# Patient Record
Sex: Female | Born: 1965 | Race: White | Hispanic: No | Smoking: Former smoker
Health system: Southern US, Community
[De-identification: ages and names within clinical notes are randomized; demographics above are authoritative.]

## PROBLEM LIST (undated history)

## (undated) DIAGNOSIS — I10 Essential (primary) hypertension: Secondary | ICD-10-CM

## (undated) DIAGNOSIS — Z9889 Other specified postprocedural states: Secondary | ICD-10-CM

## (undated) DIAGNOSIS — F329 Major depressive disorder, single episode, unspecified: Secondary | ICD-10-CM

## (undated) DIAGNOSIS — E7800 Pure hypercholesterolemia, unspecified: Secondary | ICD-10-CM

## (undated) DIAGNOSIS — T884XXA Failed or difficult intubation, initial encounter: Secondary | ICD-10-CM

## (undated) DIAGNOSIS — K219 Gastro-esophageal reflux disease without esophagitis: Secondary | ICD-10-CM

## (undated) DIAGNOSIS — R112 Nausea with vomiting, unspecified: Secondary | ICD-10-CM

## (undated) DIAGNOSIS — I639 Cerebral infarction, unspecified: Secondary | ICD-10-CM

## (undated) DIAGNOSIS — Z8632 Personal history of gestational diabetes: Secondary | ICD-10-CM

## (undated) DIAGNOSIS — Z87442 Personal history of urinary calculi: Secondary | ICD-10-CM

## (undated) DIAGNOSIS — O1490 Unspecified pre-eclampsia, unspecified trimester: Secondary | ICD-10-CM

## (undated) DIAGNOSIS — C4490 Unspecified malignant neoplasm of skin, unspecified: Secondary | ICD-10-CM

## (undated) DIAGNOSIS — F32A Depression, unspecified: Secondary | ICD-10-CM

## (undated) DIAGNOSIS — G43909 Migraine, unspecified, not intractable, without status migrainosus: Secondary | ICD-10-CM

## (undated) DIAGNOSIS — I429 Cardiomyopathy, unspecified: Secondary | ICD-10-CM

## (undated) HISTORY — PX: OTHER SURGICAL HISTORY: SHX169

## (undated) HISTORY — DX: Gastro-esophageal reflux disease without esophagitis: K21.9

## (undated) HISTORY — DX: Essential (primary) hypertension: I10

## (undated) HISTORY — DX: Pure hypercholesterolemia, unspecified: E78.00

## (undated) HISTORY — DX: Major depressive disorder, single episode, unspecified: F32.9

## (undated) HISTORY — DX: Depression, unspecified: F32.A

---
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
## 1999-04-03 HISTORY — PX: NEPHROLITHOTOMY: SUR881

## 1999-04-24 ENCOUNTER — Inpatient Hospital Stay (HOSPITAL_COMMUNITY): Admission: RE | Admit: 1999-04-24 | Discharge: 1999-04-29 | Payer: Self-pay | Admitting: Urology

## 1999-04-24 ENCOUNTER — Encounter: Payer: Self-pay | Admitting: Urology

## 1999-05-13 ENCOUNTER — Encounter: Admission: RE | Admit: 1999-05-13 | Discharge: 1999-05-13 | Payer: Self-pay | Admitting: Urology

## 1999-08-03 DIAGNOSIS — I429 Cardiomyopathy, unspecified: Secondary | ICD-10-CM

## 1999-08-03 HISTORY — DX: Cardiomyopathy, unspecified: I42.9

## 1999-08-06 ENCOUNTER — Inpatient Hospital Stay (HOSPITAL_COMMUNITY): Admission: AD | Admit: 1999-08-06 | Discharge: 1999-08-06 | Payer: Self-pay | Admitting: Obstetrics and Gynecology

## 1999-08-13 ENCOUNTER — Inpatient Hospital Stay (HOSPITAL_COMMUNITY): Admission: AD | Admit: 1999-08-13 | Discharge: 1999-08-18 | Payer: Self-pay | Admitting: *Deleted

## 1999-08-18 ENCOUNTER — Inpatient Hospital Stay (HOSPITAL_COMMUNITY): Admission: AD | Admit: 1999-08-18 | Discharge: 1999-08-20 | Payer: Self-pay | Admitting: *Deleted

## 1999-08-18 ENCOUNTER — Encounter: Payer: Self-pay | Admitting: *Deleted

## 1999-08-21 ENCOUNTER — Encounter: Admission: RE | Admit: 1999-08-21 | Discharge: 1999-11-19 | Payer: Self-pay | Admitting: *Deleted

## 1999-09-03 ENCOUNTER — Encounter: Payer: Self-pay | Admitting: Urology

## 1999-09-03 ENCOUNTER — Ambulatory Visit (HOSPITAL_COMMUNITY): Admission: RE | Admit: 1999-09-03 | Discharge: 1999-09-03 | Payer: Self-pay | Admitting: Urology

## 1999-09-07 ENCOUNTER — Encounter: Admission: RE | Admit: 1999-09-07 | Discharge: 1999-09-07 | Payer: Self-pay | Admitting: Urology

## 1999-09-07 ENCOUNTER — Encounter: Payer: Self-pay | Admitting: Urology

## 1999-11-10 ENCOUNTER — Encounter: Payer: Self-pay | Admitting: *Deleted

## 1999-11-10 ENCOUNTER — Encounter: Admission: RE | Admit: 1999-11-10 | Discharge: 1999-11-10 | Payer: Self-pay | Admitting: *Deleted

## 1999-12-09 ENCOUNTER — Other Ambulatory Visit: Admission: RE | Admit: 1999-12-09 | Discharge: 1999-12-09 | Payer: Self-pay | Admitting: *Deleted

## 2000-02-15 ENCOUNTER — Encounter: Payer: Self-pay | Admitting: Family Medicine

## 2000-02-15 ENCOUNTER — Ambulatory Visit (HOSPITAL_COMMUNITY): Admission: RE | Admit: 2000-02-15 | Discharge: 2000-02-15 | Payer: Self-pay | Admitting: Family Medicine

## 2000-05-31 ENCOUNTER — Encounter: Admission: RE | Admit: 2000-05-31 | Discharge: 2000-05-31 | Payer: Self-pay | Admitting: Urology

## 2000-05-31 ENCOUNTER — Encounter: Payer: Self-pay | Admitting: Urology

## 2000-10-07 ENCOUNTER — Other Ambulatory Visit: Admission: RE | Admit: 2000-10-07 | Discharge: 2000-10-07 | Payer: Self-pay | Admitting: *Deleted

## 2000-11-14 ENCOUNTER — Encounter: Admission: RE | Admit: 2000-11-14 | Discharge: 2000-11-14 | Payer: Self-pay | Admitting: *Deleted

## 2000-11-14 ENCOUNTER — Encounter: Payer: Self-pay | Admitting: *Deleted

## 2000-12-20 ENCOUNTER — Encounter: Admission: RE | Admit: 2000-12-20 | Discharge: 2000-12-20 | Payer: Self-pay | Admitting: Urology

## 2000-12-20 ENCOUNTER — Encounter: Payer: Self-pay | Admitting: Urology

## 2001-11-03 ENCOUNTER — Other Ambulatory Visit: Admission: RE | Admit: 2001-11-03 | Discharge: 2001-11-03 | Payer: Self-pay | Admitting: *Deleted

## 2001-11-21 ENCOUNTER — Encounter: Payer: Self-pay | Admitting: *Deleted

## 2001-11-21 ENCOUNTER — Encounter: Admission: RE | Admit: 2001-11-21 | Discharge: 2001-11-21 | Payer: Self-pay | Admitting: *Deleted

## 2002-01-23 ENCOUNTER — Encounter: Payer: Self-pay | Admitting: Urology

## 2002-01-23 ENCOUNTER — Encounter: Admission: RE | Admit: 2002-01-23 | Discharge: 2002-01-23 | Payer: Self-pay | Admitting: Urology

## 2002-02-05 ENCOUNTER — Encounter: Payer: Self-pay | Admitting: Urology

## 2002-02-05 ENCOUNTER — Ambulatory Visit (HOSPITAL_BASED_OUTPATIENT_CLINIC_OR_DEPARTMENT_OTHER): Admission: RE | Admit: 2002-02-05 | Discharge: 2002-02-05 | Payer: Self-pay | Admitting: Urology

## 2002-02-19 ENCOUNTER — Encounter: Payer: Self-pay | Admitting: Urology

## 2002-02-19 ENCOUNTER — Encounter: Admission: RE | Admit: 2002-02-19 | Discharge: 2002-02-19 | Payer: Self-pay | Admitting: Urology

## 2002-07-31 ENCOUNTER — Encounter (HOSPITAL_COMMUNITY): Admission: RE | Admit: 2002-07-31 | Discharge: 2002-07-31 | Payer: Self-pay | Admitting: Urology

## 2002-07-31 ENCOUNTER — Encounter: Payer: Self-pay | Admitting: Urology

## 2003-03-18 ENCOUNTER — Other Ambulatory Visit: Admission: RE | Admit: 2003-03-18 | Discharge: 2003-03-18 | Payer: Self-pay | Admitting: Obstetrics and Gynecology

## 2004-04-14 ENCOUNTER — Encounter: Admission: RE | Admit: 2004-04-14 | Discharge: 2004-04-14 | Payer: Self-pay | Admitting: Obstetrics and Gynecology

## 2005-07-08 ENCOUNTER — Encounter: Admission: RE | Admit: 2005-07-08 | Discharge: 2005-07-08 | Payer: Self-pay | Admitting: Obstetrics and Gynecology

## 2006-07-12 ENCOUNTER — Encounter: Admission: RE | Admit: 2006-07-12 | Discharge: 2006-07-12 | Payer: Self-pay | Admitting: Family Medicine

## 2007-03-17 ENCOUNTER — Emergency Department (HOSPITAL_COMMUNITY): Admission: EM | Admit: 2007-03-17 | Discharge: 2007-03-17 | Payer: Self-pay | Admitting: Emergency Medicine

## 2007-03-27 ENCOUNTER — Emergency Department (HOSPITAL_COMMUNITY): Admission: EM | Admit: 2007-03-27 | Discharge: 2007-03-27 | Payer: Self-pay | Admitting: Emergency Medicine

## 2007-04-07 ENCOUNTER — Encounter: Admission: RE | Admit: 2007-04-07 | Discharge: 2007-04-07 | Payer: Self-pay | Admitting: Family Medicine

## 2007-08-24 ENCOUNTER — Encounter: Admission: RE | Admit: 2007-08-24 | Discharge: 2007-08-24 | Payer: Self-pay | Admitting: Obstetrics and Gynecology

## 2007-12-08 ENCOUNTER — Emergency Department (HOSPITAL_COMMUNITY): Admission: EM | Admit: 2007-12-08 | Discharge: 2007-12-08 | Payer: Self-pay | Admitting: Family Medicine

## 2009-09-02 ENCOUNTER — Encounter: Admission: RE | Admit: 2009-09-02 | Discharge: 2009-09-02 | Payer: Self-pay | Admitting: Obstetrics and Gynecology

## 2010-05-07 ENCOUNTER — Encounter (INDEPENDENT_AMBULATORY_CARE_PROVIDER_SITE_OTHER): Payer: Self-pay | Admitting: Family Medicine

## 2010-05-07 ENCOUNTER — Ambulatory Visit (HOSPITAL_COMMUNITY): Admission: RE | Admit: 2010-05-07 | Discharge: 2010-05-07 | Payer: Self-pay | Admitting: Family Medicine

## 2010-12-18 NOTE — Discharge Summary (Signed)
[name]  Patient:    Deborah Thompson                       MRN: [id number] Adm. Date:  [id number] Disc. Date: [id number] Attending:  Ardeen Fillers                           Discharge Summary  DISCHARGE DIAGNOSES:          1. Intrauterine pregnancy at [redacted] weeks gestational                                  age, delivered.                               2. Rh positive.                               3. Pregnancy-induced hypertension.                               4. History of staghorn calculus, status post left  nephrolithectomy.                               5. Intrapartum fever.                               6. Bradycardia and hypotension postpartum.  PROCEDURE:                    Spontaneous vaginal delivery, repair of midline episiotomy, and Pitocin induction.  HISTORY OF PRESENT ILLNESS:   A 45 year old woman, gravida 2, para 1, EDC September 02, 1999, on the basis of first trimester ultrasound, admitted at 84+ weeks gestational age for induction because of worsening pregnancy-induced hypertension.  The patient is screened at [redacted] weeks gestational age with blood pressure of 128/78. Blood pressure became elevated at [redacted] weeks gestational age and the patient was treated with bed rest.  Blood pressure on her left side most recently in the office on August 11, 1999, was 150/88.  Fetal growth has been normal and amniotic fluid has remained in the normal range.  The patient has no headaches, visual changes, or epigastric pain.  Because of increasing elevation of blood pressure, she is admitted for delivery.  Antenatal course is also remarkable for:  1) First trimester urinary tract infections and hematuria.  Staghorn calculus was identified.  She underwent removal of a left staghorn calculus by left nephrolithotomy at [redacted] weeks gestational age by Sigmund I. Patsi Sears, M.D.  She has intermittently grown organisms in her urine and has been  maintained on Macrobid prophylaxis.  2) Advanced maternal age. Amniocentesis revealed 46 chromosomes with normal amniotic fluid alpha fetoprotein.  HOSPITAL COURSE:              The patient was admitted to [facility] [place].  Cervix was closed and long.  Vaginal Cytotec was used.  Pitocin was initiated on August 15, 1999.  Membranes ruptured spontaneously.  The cervix was 1 to 2 cm on the morning of August 15, 1999.  IV sedation was given and epidural was also administered at the patients request.  Blood pressures remained reasonable  during the intrapartum period.  The patient was noted treated with magnesium as she had normal laboratory studies and no symptoms of PIH.   She developed a low grade fever prior to delivery.  Unasyn was initiated for presumed chorioamnionitis. The second stage was 11 minutes.  She was delivered spontaneously of a live female, 3270 grams with Apgars of 8 and 9 over a midline episiotomy.  Nuchal cord was reduced on the perineum.  Episiotomy was repaired without difficulty.  Initial increase in bleeding was felt to be secondary to uterine atony. Vigorous bimanual massage was performed.  The patient felt uncomfortable and in order to  allow evaluation of the vagina, the epidural was rebolused.  Very shortly thereafter, the patient became nauseated, faint, and developed hypotension and bradycardia.  Gretta Cool., M.D., anesthesia, was called to assist in management.   She was treated with IV hydration, Atropine, and Ephedrine, and oxygen.  Hypotensive symptoms resolved and pulse returned to normal range. Estimated blood loss was 500 cc.  There was no evidence of vaginal or cervical laceration.  Bradycardic hypotensive episode was felt to be secondary to a confluence of factors including 2-1/2 days of bed rest, relative dehydration, more than average postpartum blood loss, and vagal stimulation from uterine massage, as well  as central neural axis block from the epidural making her unable to mount a sympathetic neural response.  The remainder of her course in the hospital was unremarkable.  Her hemoglobin stabilized at 8.1.  This was well tolerated and managed conservatively.  Blood pressures remained with the diastolics in the high 90s.  This was managed with labetolol.  She was discharged to home in satisfactory condition on the third postpartum day.  Blood pressure at that time was 140/80.  She was still slightly tachycardic which was felt to be secondary to anemia.  She is given routine status post vaginal delivery instructions.  DISCHARGE MEDICATIONS:        1. Advil.                               2. Iron.                               3. Prenatal vitamins.  FOLLOW-UP:                    She will be followed up in the office in four to ix weeks and will return in one week for blood pressure check. DD:  09/23/99 TD:  09/23/99 Job: [id number]

## 2010-12-18 NOTE — Discharge Summary (Signed)
[facility]  Patient:    Deborah Thompson                       MRN: [id number] Adm. Date:  [id number] Disc. Date: [id number] Attending:  Ardeen Fillers CC:         Oley Balm. Sung Amabile, M.D.                           Discharge Summary  DISCHARGE DIAGNOSES:          1. Pulmonary edema.                               2. Pregnancy-induced hypertension.                               3. Anemia.                               4. Heart murmur.                               5. History of Proteus urinary tract infection.                               6. Status post vaginal delivery on August 15, 1999.                               7. History of staghorn calculus status post left                                  nephrolithotomy.  HISTORY OF PRESENT ILLNESS:   A 45 year old woman, gravida 2, para 2, status post vaginal delivery on August 15, 1999, with complaints of shortness of breath increasing on the date of admission, worse with lying flat.  The patient has noticed slight cough, but no sputum or hemoptysis.  She delivered vaginally on August 15, 1999, with an estimated blood loss of 500 cc.  After delivery she experienced a hypotensive, bradycardic episode which was felt to be secondary to vagal stimulation from bimanual uterine massage in a patient who was behind on fluids and with an epidural in place and thus unable o mound a sympathetic response.  She received 3 liters of fluids IV, Atropine and  Ephedrine and did well postpartum except for a persistently elevated blood pressure which was managed with labetolol since August 17, 1999.  Discharge hemoglobin as 8.1.  The patient had been induced at term for pregnancy-induced hypertension.   She was evaluated in the MAU.  At that time she had a low grade fever of 99.4, blood pressure 173/105, heart rate 108, and respiratory rate 24.  Bibasilar rales were heard and a systolic ejection murmur at the  apex was also noted. Hemoglobin was 8.0, white blood cell count 10.4.  Oxygen saturation on room air was between 91 and 95%.  Arterial blood gases revealed pH of 7.45, pO2 70, pCO2 33.  Chest x-ray showed diffuse bilateral interstitial process.  IMPRESSION:  Clinical scenario consistent with pulmonary edema. Because of the presentation without fever, pneumonia was not favored as was pulmonary embolus not likely given the bilateral process.  HOSPITAL COURSE:              Pulmonary consult was called in.  The patient was  seen by Oley Balm. Sung Amabile, M.D. of the [facility].  The patient was given Lasix and diuresed well.  She was mildly hypokalemic and this was replaced orally.  Chest x-ray on the third hospital day showed a persistent right lower lobe infiltrate, but overall improvement of the chest x-ray.  The diagnosis of pulmonary edema was still favored and the asymmetric finding was felt to be because of positioning on her right side during most of the hospitalization.  Blood pressure was managed with labetolol during the hospitalization.  She was discharged to home in satisfactory condition with no shortness of breath on the third hospital day.  She will be followed up by Onalee Hua B. Simonds, M.D. in his office within the next week.  Results of two-dimensional echocardiogram were pending.  She will be followed up by the [facility] n the next few days. DD:  09/23/99 TD:  09/23/99 Job: [id number]

## 2011-02-26 ENCOUNTER — Other Ambulatory Visit: Payer: Self-pay | Admitting: Obstetrics and Gynecology

## 2011-02-26 DIAGNOSIS — Z1231 Encounter for screening mammogram for malignant neoplasm of breast: Secondary | ICD-10-CM

## 2011-03-04 ENCOUNTER — Ambulatory Visit
Admission: RE | Admit: 2011-03-04 | Discharge: 2011-03-04 | Disposition: A | Discharge status: Home / Self Care | Payer: 59 | Source: Ambulatory Visit | Attending: Obstetrics and Gynecology | Admitting: Obstetrics and Gynecology

## 2011-03-04 DIAGNOSIS — Z1231 Encounter for screening mammogram for malignant neoplasm of breast: Secondary | ICD-10-CM

## 2012-03-29 ENCOUNTER — Other Ambulatory Visit: Payer: Self-pay | Admitting: Obstetrics and Gynecology

## 2012-03-29 DIAGNOSIS — Z1231 Encounter for screening mammogram for malignant neoplasm of breast: Secondary | ICD-10-CM

## 2012-04-12 ENCOUNTER — Ambulatory Visit
Admission: RE | Admit: 2012-04-12 | Discharge: 2012-04-12 | Disposition: A | Discharge status: Home / Self Care | Payer: 59 | Source: Ambulatory Visit | Attending: Obstetrics and Gynecology | Admitting: Obstetrics and Gynecology

## 2012-04-12 DIAGNOSIS — Z1231 Encounter for screening mammogram for malignant neoplasm of breast: Secondary | ICD-10-CM

## 2012-09-16 ENCOUNTER — Other Ambulatory Visit: Payer: Self-pay

## 2013-04-09 ENCOUNTER — Other Ambulatory Visit: Payer: Self-pay

## 2013-04-09 DIAGNOSIS — Z1231 Encounter for screening mammogram for malignant neoplasm of breast: Secondary | ICD-10-CM

## 2013-04-26 ENCOUNTER — Ambulatory Visit
Admission: RE | Admit: 2013-04-26 | Discharge: 2013-04-26 | Disposition: A | Discharge status: Home / Self Care | Payer: 59 | Source: Ambulatory Visit

## 2013-04-26 DIAGNOSIS — Z1231 Encounter for screening mammogram for malignant neoplasm of breast: Secondary | ICD-10-CM

## 2013-04-30 ENCOUNTER — Other Ambulatory Visit: Payer: Self-pay | Admitting: Obstetrics and Gynecology

## 2013-04-30 DIAGNOSIS — R928 Other abnormal and inconclusive findings on diagnostic imaging of breast: Secondary | ICD-10-CM

## 2013-05-10 ENCOUNTER — Ambulatory Visit
Admission: RE | Admit: 2013-05-10 | Discharge: 2013-05-10 | Disposition: A | Discharge status: Home / Self Care | Payer: 59 | Source: Ambulatory Visit | Attending: Obstetrics and Gynecology | Admitting: Obstetrics and Gynecology

## 2013-05-10 DIAGNOSIS — R928 Other abnormal and inconclusive findings on diagnostic imaging of breast: Secondary | ICD-10-CM

## 2013-06-07 ENCOUNTER — Other Ambulatory Visit: Payer: Self-pay

## 2013-06-14 ENCOUNTER — Encounter: Payer: Self-pay | Admitting: Internal Medicine

## 2013-08-02 DIAGNOSIS — C4490 Unspecified malignant neoplasm of skin, unspecified: Secondary | ICD-10-CM

## 2013-08-02 HISTORY — DX: Unspecified malignant neoplasm of skin, unspecified: C44.90

## 2013-08-07 ENCOUNTER — Ambulatory Visit (AMBULATORY_SURGERY_CENTER): Payer: Self-pay

## 2013-08-07 VITALS — Ht 66.0 in | Wt 180.0 lb

## 2013-08-07 DIAGNOSIS — Z1211 Encounter for screening for malignant neoplasm of colon: Secondary | ICD-10-CM

## 2013-08-07 MED ORDER — MOVIPREP 100 G PO SOLR: 1.0000 | Freq: Once | ORAL | Status: DC

## 2013-08-20 ENCOUNTER — Ambulatory Visit (AMBULATORY_SURGERY_CENTER): Payer: 59 | Admitting: Internal Medicine

## 2013-08-20 ENCOUNTER — Encounter: Payer: Self-pay | Admitting: Internal Medicine

## 2013-08-20 VITALS — BP 166/68 | HR 74 | Temp 99.1°F | Resp 31 | Ht 66.0 in | Wt 180.0 lb

## 2013-08-20 DIAGNOSIS — Z1211 Encounter for screening for malignant neoplasm of colon: Secondary | ICD-10-CM

## 2013-08-20 MED ORDER — SODIUM CHLORIDE 0.9 % IV SOLN: 500.0000 mL | INTRAVENOUS | Status: DC

## 2013-08-20 NOTE — Progress Notes (Signed)
B/P Elevated 154/103 left arm and 155 /107 right arm room staff made aware,no new orders received.

## 2013-08-20 NOTE — Patient Instructions (Signed)
YOU HAD AN ENDOSCOPIC PROCEDURE TODAY AT [facility]: Refer to the procedure report that was given to you for any specific questions about what was found during the examination.  If the procedure report does not answer your questions, please call your gastroenterologist to clarify.  If you requested that your care partner not be given the details of your procedure findings, then the procedure report has been included in a sealed envelope for you to review at your convenience later.  YOU SHOULD EXPECT: Some feelings of bloating in the abdomen. Passage of more gas than usual.  Walking can help get rid of the air that was put into your GI tract during the procedure and reduce the bloating. If you had a lower endoscopy (such as a colonoscopy or flexible sigmoidoscopy) you may notice spotting of blood in your stool or on the toilet paper. If you underwent a bowel prep for your procedure, then you may not have a normal bowel movement for a few days.  DIET: Your first meal following the procedure should be a light meal and then it is ok to progress to your normal diet.  A half-sandwich or bowl of soup is an example of a good first meal.  Heavy or fried foods are harder to digest and may make you feel nauseous or bloated.  Likewise meals heavy in dairy and vegetables can cause extra gas to form and this can also increase the bloating.  Drink plenty of fluids but you should avoid alcoholic beverages for 24 hours.  ACTIVITY: Your care partner should take you home directly after the procedure.  You should plan to take it easy, moving slowly for the rest of the day.  You can resume normal activity the day after the procedure however you should NOT DRIVE or use heavy machinery for 24 hours (because of the sedation medicines used during the test).    SYMPTOMS TO REPORT IMMEDIATELY: A gastroenterologist can be reached at any hour.  During normal business hours, 8:30 AM to 5:00 PM Monday through Friday,  call [phone].  After hours and on weekends, please call the GI answering service at [phone] who will take a message and have the physician on call contact you.   Following lower endoscopy (colonoscopy or flexible sigmoidoscopy):  Excessive amounts of blood in the stool  Significant tenderness or worsening of abdominal pains  Swelling of the abdomen that is new, acute  Fever of 100F or higher  FOLLOW UP: Our staff will call the home number listed on your records the next business day following your procedure to check on you and address any questions or concerns that you may have at that time regarding the information given to you following your procedure. This is a courtesy call and so if there is no answer at the home number and we have not heard from you through the emergency physician on call, we will assume that you have returned to your regular daily activities without incident.  SIGNATURES/CONFIDENTIALITY: You and/or your care partner have signed paperwork which will be entered into your electronic medical record.  These signatures attest to the fact that that the information above on your After Visit Summary has been reviewed and is understood.  Full responsibility of the confidentiality of this discharge information lies with you and/or your care-partner.  Please continue your normal medications  Please read over handouts about diverticulosis and high fiber diets  Follow up colonoscopy in 10 years

## 2013-08-20 NOTE — Progress Notes (Signed)
Report to pacu rn, vss, bbs=clear 

## 2013-08-20 NOTE — Op Note (Signed)
[place]  [address]. [place], [address]   COLONOSCOPY PROCEDURE REPORT  PATIENT: Deborah, Thompson  MR#: [id number] BIRTHDATE: [birth year] , 48  yrs. old GENDER: Female ENDOSCOPIST: Eustace Quail, MD REFERRED YK:DXIPJA Ehinger, M.D. PROCEDURE DATE:  08/20/2013 PROCEDURE:   Colonoscopy, screening First Screening Colonoscopy - Avg.  risk and is 50 yrs.  old or older Yes.  Prior Negative Screening - Now for repeat screening. N/A  History of Adenoma - Now for follow-up colonoscopy & has been > or = to 3 yrs.  N/A  Polyps Removed Today? No.  Recommend repeat exam, <10 yrs? No. ASA CLASS:   Class II INDICATIONS:average risk screening. MEDICATIONS: MAC sedation, administered by CRNA and propofol (Diprivan) 300mg  IV  DESCRIPTION OF PROCEDURE:   After the risks benefits and alternatives of the procedure were thoroughly explained, informed consent was obtained.  A digital rectal exam revealed no abnormalities of the rectum.   The LB [id number]  endoscope was introduced through the anus and advanced to the cecum, which was identified by both the appendix and ileocecal valve. No adverse events experienced.   The quality of the prep was good, using MoviPrep  The instrument was then slowly withdrawn as the colon was fully examined.      COLON FINDINGS: Moderate diverticulosis was noted in the sigmoid colon.   The colon was otherwise normal.  There was no inflammation, polyps or cancers .  Retroflexed views revealed no abnormalities. The time to cecum=2 minutes 39 seconds.  Withdrawal time=12 minutes 03 seconds.  The scope was withdrawn and the procedure completed.  COMPLICATIONS: There were no complications.  ENDOSCOPIC IMPRESSION: 1.   Moderate diverticulosis was noted in the sigmoid colon 2.   The colon was otherwise normal  RECOMMENDATIONS: 1. Continue current colorectal screening recommendations for "routine risk" patients with a repeat  colonoscopy in 10 years.   eSigned:  Eustace Quail, MD 08/20/2013 12:07 PM   cc: Gaynelle Arabian, MD and The Patient

## 2013-08-21 ENCOUNTER — Telehealth: Payer: Self-pay | Admitting: *Deleted

## 2013-08-21 NOTE — Telephone Encounter (Signed)
Left message that we called for f/u 

## 2015-08-03 DIAGNOSIS — I639 Cerebral infarction, unspecified: Secondary | ICD-10-CM

## 2015-08-03 HISTORY — DX: Cerebral infarction, unspecified: I63.9

## 2015-08-06 DIAGNOSIS — M9901 Segmental and somatic dysfunction of cervical region: Secondary | ICD-10-CM | POA: Diagnosis not present

## 2015-08-06 DIAGNOSIS — M9902 Segmental and somatic dysfunction of thoracic region: Secondary | ICD-10-CM | POA: Diagnosis not present

## 2015-08-06 DIAGNOSIS — M546 Pain in thoracic spine: Secondary | ICD-10-CM | POA: Diagnosis not present

## 2015-08-06 DIAGNOSIS — M545 Low back pain: Secondary | ICD-10-CM | POA: Diagnosis not present

## 2015-08-07 DIAGNOSIS — M9902 Segmental and somatic dysfunction of thoracic region: Secondary | ICD-10-CM | POA: Diagnosis not present

## 2015-08-07 DIAGNOSIS — M545 Low back pain: Secondary | ICD-10-CM | POA: Diagnosis not present

## 2015-08-07 DIAGNOSIS — M546 Pain in thoracic spine: Secondary | ICD-10-CM | POA: Diagnosis not present

## 2015-08-07 DIAGNOSIS — M9901 Segmental and somatic dysfunction of cervical region: Secondary | ICD-10-CM | POA: Diagnosis not present

## 2015-08-11 MED FILL — METOPROLOL SUCC ER 100 MG T: 100 | 90 days supply | Qty: 90 | Fill #1

## 2015-08-11 MED FILL — ATORVASTATIN 40 MG TABLET: 40 | 90 days supply | Qty: 90 | Fill #0

## 2015-08-20 DIAGNOSIS — I10 Essential (primary) hypertension: Secondary | ICD-10-CM | POA: Diagnosis not present

## 2015-08-20 DIAGNOSIS — Z0000 Encounter for general adult medical examination without abnormal findings: Secondary | ICD-10-CM | POA: Diagnosis not present

## 2015-08-20 DIAGNOSIS — M792 Neuralgia and neuritis, unspecified: Secondary | ICD-10-CM | POA: Diagnosis not present

## 2015-08-20 DIAGNOSIS — E7800 Pure hypercholesterolemia, unspecified: Secondary | ICD-10-CM | POA: Diagnosis not present

## 2015-08-20 DIAGNOSIS — K219 Gastro-esophageal reflux disease without esophagitis: Secondary | ICD-10-CM | POA: Diagnosis not present

## 2015-08-20 DIAGNOSIS — F322 Major depressive disorder, single episode, severe without psychotic features: Secondary | ICD-10-CM | POA: Diagnosis not present

## 2015-09-01 MED FILL — PANTOPRAZOLE SOD DR 40 MG T: 40 | 90 days supply | Qty: 90 | Fill #0

## 2015-09-01 MED FILL — VENLAFAXINE HCL ER 75 MG CA: 75 | 90 days supply | Qty: 90 | Fill #0

## 2015-09-30 DIAGNOSIS — H5213 Myopia, bilateral: Secondary | ICD-10-CM | POA: Diagnosis not present

## 2015-09-30 DIAGNOSIS — H524 Presbyopia: Secondary | ICD-10-CM | POA: Diagnosis not present

## 2015-10-03 DIAGNOSIS — I10 Essential (primary) hypertension: Secondary | ICD-10-CM | POA: Diagnosis not present

## 2015-10-13 MED FILL — LISINOPRIL 40 MG TABLET: 40 | 90 days supply | Qty: 90 | Fill #1

## 2015-11-03 ENCOUNTER — Ambulatory Visit (INDEPENDENT_AMBULATORY_CARE_PROVIDER_SITE_OTHER): Payer: 59 | Admitting: Cardiology

## 2015-11-03 ENCOUNTER — Encounter: Payer: Self-pay | Admitting: Cardiology

## 2015-11-03 VITALS — BP 150/98 | HR 88 | Ht 66.0 in | Wt 186.4 lb

## 2015-11-03 DIAGNOSIS — I10 Essential (primary) hypertension: Secondary | ICD-10-CM | POA: Diagnosis not present

## 2015-11-03 DIAGNOSIS — E785 Hyperlipidemia, unspecified: Secondary | ICD-10-CM | POA: Diagnosis not present

## 2015-11-03 MED ORDER — SPIRONOLACTONE 25 MG PO TABS: 12.5000 mg | ORAL_TABLET | Freq: Every day | ORAL | Status: DC

## 2015-11-03 MED FILL — METOPROLOL SUCC ER 100 MG T: 100 | 30 days supply | Qty: 45 | Fill #0

## 2015-11-03 MED FILL — SPIRONOLACTONE 25 MG TABLET: 25 | 90 days supply | Qty: 45 | Fill #0

## 2015-11-03 NOTE — Progress Notes (Signed)
Cardiology Office Note    Date:  11/03/2015   ID:  Deborah Thompson, DOB Mar 30, 1966, MRN [id number]  PCP:  Simona Huh, MD  Cardiologist:   Candee Furbish, MD     History of Present Illness:  Deborah Thompson is a 50 y.o. female here for evaluation of difficult to control hypertension at the request of Dr. [name].  Office note from 10/03/15 reviewed home blood pressures were high, no chest pain, no dyspnea, no dizziness.   -Previously stopped amlodipine on 01/2015 because of tachycardia noted when she tried to donate blood. -Tolerating lisinopril 40 mg daily -Side effects to hydrochlorothiazide, stopped -Metoprolol succinate-XL 100 mg daily. Blood pressure has been in the 160/100 range with pulses in the 70s.  Was on Lipitor since her 28's.   Preeclampsia both pregnancy.   For last year BP increased. Has gained a few pounds over this time.  Sometimes may have a headache but no strokelike symptoms, visual disturbance, anginal symptoms, shortness of breath.  Mom died age 18 suddenly.  Father had MI in 29's.   Trying to exercise. Diet habits terribly. 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  ECHO 2011 - normal  Renal stones.   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 Divorced, 2 children, one is vegan  Past Medical History  Diagnosis Date  . Hypercholesterolemia   . Hypertension   . GERD (gastroesophageal reflux disease)   . Depression     Past Surgical History  Procedure Laterality Date  . Nephrolithotomy  04-1999    during pregnancy, left kidney  . Wisdom tooth extraction  1984    Current Medications: Outpatient Prescriptions Prior to Visit  Medication Sig Dispense Refill  . atorvastatin (LIPITOR) 40 MG tablet Take 40 mg by mouth daily.    . Calcium Carb-Cholecalciferol (CALCIUM + D3 PO) Take by mouth. 600 mg    . Multiple Vitamin (MULTIVITAMIN) tablet Take 1 tablet by mouth daily.    . Omega-3 Fatty Acids (FISH OIL) 1000 MG CPDR Take  by mouth 2 (two) times daily.    Marland Kitchen OVER THE COUNTER MEDICATION Glucosamine 4000mg  daily    . pantoprazole (PROTONIX) 40 MG tablet Take 40 mg by mouth daily.    Marland Kitchen venlafaxine XR (EFFEXOR-XR) 75 MG 24 hr capsule Take 75 mg by mouth daily with breakfast.    . lisinopril (PRINIVIL,ZESTRIL) 20 MG tablet Take 20 mg by mouth daily.     No facility-administered medications prior to visit.     Allergies:   Review of patient's allergies indicates no known allergies.   Social History   Social History  . Marital Status: Married    Spouse Name: N/A  . Number of Children: N/A  . Years of Education: N/A   Social History Main Topics  . Smoking status: Former Smoker    Types: Cigarettes  . Smokeless tobacco: Never Used  . Alcohol Use: Yes     Comment: SOCIALLY  . Drug Use: No  . Sexual Activity: Not Asked   Other Topics Concern  . None   Social History Narrative     Family History:  The patient's family history includes Heart disease in her father and mother. There is no history of Colon cancer.   ROS:   Please see the history of present illness.   Positive for depression, headaches ROS All other systems reviewed and are negative.   PHYSICAL EXAM:   VS:  BP 150/98 mmHg  Pulse 88  Ht 5'  6" (1.676 m)  Wt 186 lb 6.4 oz (84.55 kg)  BMI 30.10 kg/m2   GEN: Well nourished, well developed, in no acute distress HEENT: normal Neck: no JVD, carotid bruits, or masses Cardiac: RRR; no murmurs, rubs, or gallops,no edema  Respiratory:  clear to auscultation bilaterally, normal work of breathing GI: soft, nontender, nondistended, + BS, no abdominal bruits MS: no deformity or atrophy Skin: warm and dry, no rash Neuro:  Alert and Oriented x 3, Strength and sensation are intact Psych: euthymic mood, full affect  Wt Readings from Last 3 Encounters:  11/03/15 186 lb 6.4 oz (84.55 kg)  08/20/13 180 lb (81.647 kg)  08/07/13 180 lb (81.647 kg)      Studies/Labs Reviewed:   EKG:  EKG is  ordered today.  The ekg ordered today demonstrates 11/03/15-sinus rhythm, 88, nonspecific ST-T wave changes personally viewed  Recent Labs: No results found for requested labs within last 365 days.   Personally reviewed lab work from 02/25/15-hemoglobin 12.8, platelets 257, TSH 1.78, sodium 138, potassium 3.8, creatinine 0.75, LDL 89, triglycerides 233, HDL 42, ALT 21  Lipid Panel No results found for: CHOL, TRIG, HDL, CHOLHDL, VLDL, LDLCALC, LDLDIRECT as above  Additional studies/ records that were reviewed today include:  Office notes, lab work, EKG    ASSESSMENT:    1. Uncontrolled hypertension   2. Essential hypertension   3. Hyperlipidemia      PLAN:  In order of problems listed above:  Uncontrolled hypertension -After her second pregnancy and second bout of preeclampsia, lisinopril has been able to maintain her blood pressure with monotherapy. Until recently, she has required increased pharmacotherapy. Her weight has increased slightly as well. Encouraged her to watch her salt intake, diet, exercise, weight loss. Decrease overall caffeine use. -We will check a renal duplex ultrasound to ensure that there is no gross evidence of renal artery stenosis or fibromuscular dysplasia. -We will start spironolactone 12.5 mg once a day and check a basic metabolic profile in one week. As I described to her, sometimes when people are struggling with multiple pharmacologic therapy for blood pressure, the addition of spironolactone can help out significantly. If this does not work, potentially trying a low-dose loop diuretic such as Lasix 20 mg may be beneficial. -She does not seem to tolerate amlodipine well in the past because of potential tachycardia, she did not tolerate hydrochlorothiazide well, side effects.  Hyperlipidemia -Atorvastatin since her 4s. Excellent lipid control.  Family history of early coronary artery disease -Father heart attack in his 57s, mother died suddenly in her  34s. Continue with aggressive risk factor modification.  Medication Adjustments/Labs and Tests Ordered: Current medicines are reviewed at length with the patient today.  Concerns regarding medicines are outlined above.  Medication changes, Labs and Tests ordered today are listed in the Patient Instructions below. Patient Instructions  Medication Instructions:  Please start Spironolactone 25 mg 1/2 tablet a day. Continue all other medications as listed.  Labwork: Please return in 1 week for lab work (BMP)  Testing/Procedures: Your physician has requested that you have a renal artery duplex. During this test, an ultrasound is used to evaluate blood flow to the kidneys. Allow one hour for this exam. Do not eat after midnight the day before and avoid carbonated beverages. Take your medications as you usually do.  Follow-Up: Follow up in 1 month with Dr Marlou Porch.  If you need a refill on your cardiac medications before your next appointment, please call your pharmacy.  Thank you  for choosing [facility]!!    '      Signed, Candee Furbish, MD  11/03/2015 12:17 PM    [facility] [place], [place], [place]  [address] Phone: [phone]; Fax: [phone]

## 2015-11-03 NOTE — Patient Instructions (Signed)
Medication Instructions:  Please start Spironolactone 25 mg 1/2 tablet a day. Continue all other medications as listed.  Labwork: Please return in 1 week for lab work (BMP)  Testing/Procedures: Your physician has requested that you have a renal artery duplex. During this test, an ultrasound is used to evaluate blood flow to the kidneys. Allow one hour for this exam. Do not eat after midnight the day before and avoid carbonated beverages. Take your medications as you usually do.  Follow-Up: Follow up in 1 month with Dr Marlou Porch.  If you need a refill on your cardiac medications before your next appointment, please call your pharmacy.  Thank you for choosing [facility]!!    '

## 2015-11-10 ENCOUNTER — Other Ambulatory Visit (INDEPENDENT_AMBULATORY_CARE_PROVIDER_SITE_OTHER): Payer: 59 | Admitting: *Deleted

## 2015-11-10 DIAGNOSIS — M9902 Segmental and somatic dysfunction of thoracic region: Secondary | ICD-10-CM | POA: Diagnosis not present

## 2015-11-10 DIAGNOSIS — M545 Low back pain: Secondary | ICD-10-CM | POA: Diagnosis not present

## 2015-11-10 DIAGNOSIS — I10 Essential (primary) hypertension: Secondary | ICD-10-CM

## 2015-11-10 DIAGNOSIS — M546 Pain in thoracic spine: Secondary | ICD-10-CM | POA: Diagnosis not present

## 2015-11-10 DIAGNOSIS — M9901 Segmental and somatic dysfunction of cervical region: Secondary | ICD-10-CM | POA: Diagnosis not present

## 2015-11-10 LAB — BASIC METABOLIC PANEL
BUN: 11 mg/dL (ref 7–25)
CHLORIDE: 101 mmol/L (ref 98–110)
CO2: 29 mmol/L (ref 20–31)
CREATININE: 0.64 mg/dL (ref 0.50–1.05)
Calcium: 10.2 mg/dL (ref 8.6–10.4)
Glucose, Bld: 92 mg/dL (ref 65–99)
POTASSIUM: 3.9 mmol/L (ref 3.5–5.3)
SODIUM: 139 mmol/L (ref 135–146)

## 2015-11-10 MED FILL — ATORVASTATIN 40 MG TABLET: 40 | 90 days supply | Qty: 90 | Fill #0

## 2015-11-11 ENCOUNTER — Ambulatory Visit (HOSPITAL_COMMUNITY)
Admission: RE | Admit: 2015-11-11 | Discharge: 2015-11-11 | Disposition: A | Discharge status: Home / Self Care | Payer: 59 | Source: Ambulatory Visit | Attending: Cardiology | Admitting: Cardiology

## 2015-11-11 DIAGNOSIS — I10 Essential (primary) hypertension: Secondary | ICD-10-CM | POA: Insufficient documentation

## 2015-11-11 DIAGNOSIS — N1330 Unspecified hydronephrosis: Secondary | ICD-10-CM | POA: Insufficient documentation

## 2015-11-11 DIAGNOSIS — F329 Major depressive disorder, single episode, unspecified: Secondary | ICD-10-CM | POA: Insufficient documentation

## 2015-11-11 DIAGNOSIS — E7800 Pure hypercholesterolemia, unspecified: Secondary | ICD-10-CM | POA: Insufficient documentation

## 2015-11-11 DIAGNOSIS — K219 Gastro-esophageal reflux disease without esophagitis: Secondary | ICD-10-CM | POA: Diagnosis not present

## 2015-11-18 DIAGNOSIS — Z6829 Body mass index (BMI) 29.0-29.9, adult: Secondary | ICD-10-CM | POA: Diagnosis not present

## 2015-11-18 DIAGNOSIS — Z1231 Encounter for screening mammogram for malignant neoplasm of breast: Secondary | ICD-10-CM | POA: Diagnosis not present

## 2015-11-18 DIAGNOSIS — Z01419 Encounter for gynecological examination (general) (routine) without abnormal findings: Secondary | ICD-10-CM | POA: Diagnosis not present

## 2015-11-24 MED FILL — VENLAFAXINE HCL ER 75 MG CA: 75 | 90 days supply | Qty: 90 | Fill #1

## 2015-12-03 ENCOUNTER — Encounter: Payer: Self-pay | Admitting: Cardiology

## 2015-12-03 ENCOUNTER — Ambulatory Visit (INDEPENDENT_AMBULATORY_CARE_PROVIDER_SITE_OTHER): Payer: 59 | Admitting: Cardiology

## 2015-12-03 VITALS — BP 124/80 | HR 98 | Ht 66.5 in | Wt 186.8 lb

## 2015-12-03 DIAGNOSIS — I10 Essential (primary) hypertension: Secondary | ICD-10-CM

## 2015-12-03 DIAGNOSIS — E785 Hyperlipidemia, unspecified: Secondary | ICD-10-CM

## 2015-12-03 NOTE — Progress Notes (Signed)
Cardiology Office Note    Date:  12/03/2015   ID:  KEYSHA Thompson, DOB July 17, 1966, MRN [id number]  PCP:  Simona Huh, MD  Cardiologist:   Candee Furbish, MD     History of Present Illness:  Deborah Thompson is a 50 y.o. female here for evaluation of difficult to control hypertension at the request of Dr. [name].  Office note from 10/03/15 reviewed home blood pressures were high, no chest pain, no dyspnea, no dizziness.   -Previously stopped amlodipine on 01/2015 because of tachycardia noted when she tried to donate blood. -Tolerating lisinopril 40 mg daily -Side effects to hydrochlorothiazide, stopped -Metoprolol succinate-XL 100 mg daily. Blood pressure has been in the 160/100 range with pulses in the 70s.  Was on Lipitor since her 31's.   Preeclampsia both pregnancy.   For last year BP increased. Has gained a few pounds over this time.  Sometimes may have a headache but no strokelike symptoms, visual disturbance, anginal symptoms, shortness of breath.  Mom died age 16 suddenly.  Father had MI in 10's.   Trying to exercise. Diet habits terribly. 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  ECHO 2011 - normal  Renal stones.   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 Divorced, 2 children, one is vegan  Past Medical History  Diagnosis Date  . Hypercholesterolemia   . Hypertension   . GERD (gastroesophageal reflux disease)   . Depression     Past Surgical History  Procedure Laterality Date  . Nephrolithotomy  04-1999    during pregnancy, left kidney  . Wisdom tooth extraction  1984    Current Medications: Outpatient Prescriptions Prior to Visit  Medication Sig Dispense Refill  . atorvastatin (LIPITOR) 40 MG tablet Take 40 mg by mouth daily.    . Calcium Carb-Cholecalciferol (CALCIUM + D3 PO) Take by mouth. 600 mg    . lisinopril (PRINIVIL,ZESTRIL) 40 MG tablet Take 40 mg by mouth daily.   1  . metoprolol succinate (TOPROL-XL) 100 MG 24 hr  tablet Take 50 mg by mouth daily.     . Multiple Vitamin (MULTIVITAMIN) tablet Take 1 tablet by mouth daily.    . Omega-3 Fatty Acids (FISH OIL) 1000 MG CPDR Take by mouth 2 (two) times daily.    Marland Kitchen OVER THE COUNTER MEDICATION Glucosamine 4000mg  daily    . pantoprazole (PROTONIX) 40 MG tablet Take 40 mg by mouth daily.    Marland Kitchen spironolactone (ALDACTONE) 25 MG tablet Take 0.5 tablets (12.5 mg total) by mouth daily. 45 tablet 3  . venlafaxine XR (EFFEXOR-XR) 75 MG 24 hr capsule Take 75 mg by mouth daily with breakfast.     No facility-administered medications prior to visit.     Allergies:   Review of patient's allergies indicates no known allergies.   Social History   Social History  . Marital Status: Married    Spouse Name: N/A  . Number of Children: N/A  . Years of Education: N/A   Social History Main Topics  . Smoking status: Former Smoker    Types: Cigarettes  . Smokeless tobacco: Never Used  . Alcohol Use: Yes     Comment: SOCIALLY  . Drug Use: No  . Sexual Activity: Not Asked   Other Topics Concern  . None   Social History Narrative     Family History:  The patient's family history includes Heart disease in her father and mother. There is no history of Colon cancer.   ROS:  Please see the history of present illness.   Positive for depression, headaches ROS All other systems reviewed and are negative.   PHYSICAL EXAM:   VS:  BP 124/80 mmHg  Pulse 98  Ht 5' 6.5" (1.689 m)  Wt 186 lb 12.8 oz (84.732 kg)  BMI 29.70 kg/m2   GEN: Well nourished, well developed, in no acute distress HEENT: normal Neck: no JVD, carotid bruits, or masses Cardiac: RRR; no murmurs, rubs, or gallops,no edema  Respiratory:  clear to auscultation bilaterally, normal work of breathing GI: soft, nontender, nondistended, + BS, no abdominal bruits MS: no deformity or atrophy Skin: warm and dry, no rash Neuro:  Alert and Oriented x 3, Strength and sensation are intact Psych: euthymic mood,  full affect  Wt Readings from Last 3 Encounters:  12/03/15 186 lb 12.8 oz (84.732 kg)  11/03/15 186 lb 6.4 oz (84.55 kg)  08/20/13 180 lb (81.647 kg)      Studies/Labs Reviewed:   EKG:  EKG is ordered today.  The ekg ordered today demonstrates 11/03/15-sinus rhythm, 88, nonspecific ST-T wave changes personally viewed  Recent Labs: 11/10/2015: BUN 11; Creat 0.64; Potassium 3.9; Sodium 139   Personally reviewed lab work from 02/25/15-hemoglobin 12.8, platelets 257, TSH 1.78, sodium 138, potassium 3.8, creatinine 0.75, LDL 89, triglycerides 233, HDL 42, ALT 21  Lipid Panel No results found for: CHOL, TRIG, HDL, CHOLHDL, VLDL, LDLCALC, LDLDIRECT as above  Additional studies/ records that were reviewed today include:  Office notes, lab work, EKG  Renal duplex 11/11/15: Normal caliber abdominal aorta. Normal and symmetrical kidney size.  Hydronephrosis is noted in the left kidney, with what appears to be a stone.  Normal renal arteries, bilaterally. The IVC and renal veins are patent.  Overall reassuring renal arteries with no evidence of renal artery stenosis. She does appear to have previous issues with nephrolithiasis of the left kidney. Please send results to Dr. [name] Thank you.  Candee Furbish, MD   ASSESSMENT:    1. Essential hypertension   2. Hyperlipidemia      PLAN:  In order of problems listed above:  Uncontrolled hypertension -After her second pregnancy and second bout of preeclampsia, lisinopril has been able to maintain her blood pressure with monotherapy. Until recently, she has required increased pharmacotherapy. Her weight has increased slightly as well. Encouraged her to watch her salt intake, diet, exercise, weight loss. Decrease overall caffeine use. -Renal duplex ultrasound- no gross evidence of renal artery stenosis or fibromuscular dysplasia. -We will start spironolactone 12.5 mg once a day -basic metabolic profile reassuring post start. As I described  to her, sometimes when people are struggling with multiple pharmacologic therapy for blood pressure, the addition of spironolactone can help out significantly. If this does not work, potentially trying a low-dose loop diuretic such as Lasix 20 mg may be beneficial. -She does not seem to tolerate amlodipine well in the past because of potential tachycardia, she did not tolerate hydrochlorothiazide well, side effects.  Hyperlipidemia -Atorvastatin since her 55s. Excellent lipid control.  Family history of early coronary artery disease -Father heart attack in his 81s, mother died suddenly in her 71s. Continue with aggressive risk factor modification.  Medication Adjustments/Labs and Tests Ordered: Current medicines are reviewed at length with the patient today.  Concerns regarding medicines are outlined above.  Medication changes, Labs and Tests ordered today are listed in the Patient Instructions below. Patient Instructions  Medication Instructions:  Your physician recommends that you continue on your current medications as  directed. Please refer to the Current Medication list given to you today.   Labwork: none  Testing/Procedures: none  Follow-Up: Your physician wants you to follow-up in: [place]. You will receive a reminder letter in the mail two months in advance. If you don't receive a letter, please call our office to schedule the follow-up appointment.   Any Other Special Instructions Will Be Listed Below (If Applicable).     If you need a refill on your cardiac medications before your next appointment, please call your pharmacy.       Bobby Rumpf, MD  12/03/2015 8:59 AM    [address], [place], [place]  [address] Phone: [phone]; Fax: [phone]

## 2015-12-03 NOTE — Patient Instructions (Signed)
Medication Instructions:  Your physician recommends that you continue on your current medications as directed. Please refer to the Current Medication list given to you today.   Labwork: none  Testing/Procedures: none  Follow-Up: Your physician wants you to follow-up in: [place]. You will receive a reminder letter in the mail two months in advance. If you don't receive a letter, please call our office to schedule the follow-up appointment.   Any Other Special Instructions Will Be Listed Below (If Applicable).     If you need a refill on your cardiac medications before your next appointment, please call your pharmacy.

## 2015-12-11 MED FILL — PANTOPRAZOLE SOD DR 40 MG T: 40 | 90 days supply | Qty: 90 | Fill #1

## 2016-01-01 DIAGNOSIS — H35371 Puckering of macula, right eye: Secondary | ICD-10-CM | POA: Diagnosis not present

## 2016-01-01 DIAGNOSIS — H43813 Vitreous degeneration, bilateral: Secondary | ICD-10-CM | POA: Diagnosis not present

## 2016-01-12 MED FILL — LISINOPRIL 40 MG TABLET: 40 | 90 days supply | Qty: 90 | Fill #0

## 2016-01-23 ENCOUNTER — Other Ambulatory Visit (HOSPITAL_COMMUNITY): Payer: Self-pay | Admitting: Family Medicine

## 2016-01-23 DIAGNOSIS — R51 Headache: Secondary | ICD-10-CM

## 2016-01-23 DIAGNOSIS — R5383 Other fatigue: Secondary | ICD-10-CM | POA: Diagnosis not present

## 2016-01-23 DIAGNOSIS — R410 Disorientation, unspecified: Secondary | ICD-10-CM

## 2016-01-23 DIAGNOSIS — R519 Headache, unspecified: Secondary | ICD-10-CM

## 2016-01-24 ENCOUNTER — Ambulatory Visit (HOSPITAL_COMMUNITY)
Admission: RE | Admit: 2016-01-24 | Discharge: 2016-01-24 | Disposition: A | Discharge status: Home / Self Care | Payer: 59 | Source: Ambulatory Visit | Attending: Family Medicine | Admitting: Family Medicine

## 2016-01-24 DIAGNOSIS — R410 Disorientation, unspecified: Secondary | ICD-10-CM | POA: Diagnosis not present

## 2016-01-24 DIAGNOSIS — R51 Headache: Secondary | ICD-10-CM | POA: Diagnosis not present

## 2016-01-24 DIAGNOSIS — R9089 Other abnormal findings on diagnostic imaging of central nervous system: Secondary | ICD-10-CM | POA: Diagnosis not present

## 2016-01-24 DIAGNOSIS — R519 Headache, unspecified: Secondary | ICD-10-CM

## 2016-01-24 LAB — POCT I-STAT CREATININE: CREATININE: 0.8 mg/dL (ref 0.44–1.00)

## 2016-01-24 MED ORDER — GADOBENATE DIMEGLUMINE 529 MG/ML IV SOLN
18.0000 mL | Freq: Once | INTRAVENOUS | Status: AC | PRN
  Administered 2016-01-24: 18 mL via INTRAVENOUS

## 2016-01-26 ENCOUNTER — Other Ambulatory Visit (HOSPITAL_COMMUNITY): Payer: Self-pay | Admitting: Family Medicine

## 2016-01-26 DIAGNOSIS — I639 Cerebral infarction, unspecified: Secondary | ICD-10-CM

## 2016-01-30 ENCOUNTER — Ambulatory Visit (HOSPITAL_BASED_OUTPATIENT_CLINIC_OR_DEPARTMENT_OTHER)
Admission: RE | Admit: 2016-01-30 | Discharge: 2016-01-30 | Disposition: A | Discharge status: Home / Self Care | Payer: 59 | Source: Ambulatory Visit | Attending: Family Medicine | Admitting: Family Medicine

## 2016-01-30 ENCOUNTER — Other Ambulatory Visit (HOSPITAL_COMMUNITY): Payer: Self-pay | Admitting: Family Medicine

## 2016-01-30 ENCOUNTER — Ambulatory Visit (HOSPITAL_COMMUNITY)
Admission: RE | Admit: 2016-01-30 | Discharge: 2016-01-30 | Disposition: A | Discharge status: Home / Self Care | Payer: 59 | Source: Ambulatory Visit | Attending: Family Medicine | Admitting: Family Medicine

## 2016-01-30 DIAGNOSIS — I639 Cerebral infarction, unspecified: Secondary | ICD-10-CM | POA: Diagnosis not present

## 2016-01-30 DIAGNOSIS — I10 Essential (primary) hypertension: Secondary | ICD-10-CM | POA: Diagnosis not present

## 2016-01-30 LAB — ECHOCARDIOGRAM COMPLETE
E decel time: 282 msec
EERAT: 7.47
FS: 43 % (ref 28–44)
IVS/LV PW RATIO, ED: 0.97
LA diam end sys: 38 mm
LA diam index: 1.88 cm/m2
LA vol A4C: 33.3 ml
LA vol index: 21.5 mL/m2
LASIZE: 38 mm
LAVOL: 43.3 mL
LDCA: 3.14 cm2
LV E/e' medial: 7.47
LV E/e'average: 7.47
LV PW d: 9.75 mm — AB (ref 0.6–1.1)
LV TDI E'LATERAL: 10.2
LV e' LATERAL: 10.2 cm/s
LVOTD: 20 mm
MV Dec: 282
MV Peak grad: 2 mmHg
MV pk A vel: 106 m/s
MVPKEVEL: 76.2 m/s
RV LATERAL S' VELOCITY: 15 cm/s
TAPSE: 19.1 mm
TDI e' medial: 7.29

## 2016-01-30 LAB — VAS US CAROTID
LEFT ECA DIAS: -15 cm/s
LEFT VERTEBRAL DIAS: -23 cm/s
LICADDIAS: -38 cm/s
LICADSYS: -78 cm/s
LICAPDIAS: 18 cm/s
LICAPSYS: 63 cm/s
Left CCA dist dias: -21 cm/s
Left CCA dist sys: -96 cm/s
Left CCA prox dias: 22 cm/s
Left CCA prox sys: 123 cm/s
RIGHT ECA DIAS: -22 cm/s
Right CCA prox dias: -24 cm/s
Right CCA prox sys: -99 cm/s
Right cca dist sys: -85 cm/s

## 2016-01-30 NOTE — Progress Notes (Signed)
  Echocardiogram 2D Echocardiogram has been performed.  Deborah Thompson 01/30/2016, 3:34 PM

## 2016-01-30 NOTE — Progress Notes (Signed)
Preliminary results by tech - Carotid Duplex Completed. No evidence of a stenosis noted in bilateral carotid arteries. Vertebral arteries demonstrated antegrade flow.  Oda Cogan, BS, RDMS, RVT

## 2016-02-02 MED FILL — SPIRONOLACTONE 25 MG TABLET: 25 | 90 days supply | Qty: 45 | Fill #1

## 2016-02-02 MED FILL — METOPROLOL SUCC ER 100 MG T: 100 | 30 days supply | Qty: 45 | Fill #1

## 2016-02-10 MED FILL — ATORVASTATIN 40 MG TABLET: 40 | 90 days supply | Qty: 90 | Fill #1

## 2016-02-23 MED FILL — VENLAFAXINE HCL ER 75 MG CA: 75 | 90 days supply | Qty: 90 | Fill #2

## 2016-03-01 ENCOUNTER — Ambulatory Visit: Payer: 59 | Admitting: Neurology

## 2016-03-02 ENCOUNTER — Ambulatory Visit (INDEPENDENT_AMBULATORY_CARE_PROVIDER_SITE_OTHER): Payer: 59 | Admitting: Neurology

## 2016-03-02 ENCOUNTER — Other Ambulatory Visit (INDEPENDENT_AMBULATORY_CARE_PROVIDER_SITE_OTHER): Payer: 59

## 2016-03-02 ENCOUNTER — Encounter: Payer: Self-pay | Admitting: Neurology

## 2016-03-02 VITALS — BP 136/94 | HR 102 | Ht 66.0 in | Wt 179.0 lb

## 2016-03-02 DIAGNOSIS — R739 Hyperglycemia, unspecified: Secondary | ICD-10-CM | POA: Diagnosis not present

## 2016-03-02 DIAGNOSIS — R002 Palpitations: Secondary | ICD-10-CM | POA: Diagnosis not present

## 2016-03-02 DIAGNOSIS — I10 Essential (primary) hypertension: Secondary | ICD-10-CM

## 2016-03-02 DIAGNOSIS — E7800 Pure hypercholesterolemia, unspecified: Secondary | ICD-10-CM

## 2016-03-02 DIAGNOSIS — I639 Cerebral infarction, unspecified: Secondary | ICD-10-CM

## 2016-03-02 LAB — BASIC METABOLIC PANEL
BUN: 16 mg/dL (ref 6–23)
CALCIUM: 9.6 mg/dL (ref 8.4–10.5)
CO2: 29 meq/L (ref 19–32)
Chloride: 105 mEq/L (ref 96–112)
Creatinine, Ser: 0.78 mg/dL (ref 0.40–1.20)
GFR: 82.25 mL/min (ref >60.00)
GLUCOSE: 105 mg/dL — AB (ref 70–99)
POTASSIUM: 3.8 meq/L (ref 3.5–5.1)
SODIUM: 141 meq/L (ref 135–145)

## 2016-03-02 LAB — HEMOGLOBIN A1C: Hgb A1c MFr Bld: 5.9 % (ref 4.6–6.5)

## 2016-03-02 LAB — LIPID PANEL
CHOL/HDL RATIO: 4
Cholesterol: 152 mg/dL (ref 0–200)
HDL: 38.6 mg/dL — AB (ref >39.00)
LDL Cholesterol: 74 mg/dL (ref 0–99)
NonHDL: 113.31
Triglycerides: 196 mg/dL — ABNORMAL HIGH (ref 0.0–149.0)
VLDL: 39.2 mg/dL (ref 0.0–40.0)

## 2016-03-02 NOTE — Progress Notes (Signed)
NEUROLOGY CONSULTATION NOTE  Deborah Thompson MRN: [id number] DOB: [birth year]  Referring provider: Dr. [name] Primary care provider: Dr. [name]  Reason for consult:  stroke  HISTORY OF PRESENT ILLNESS: Deborah Thompson is a 50 year old left-handed woman with hypertension, hypercholesterolemia, depression and GERD who presents for stroke.  History obtained by patient and PCP note.  In June, she woke up one morning with one of her habitual headaches, which she has had since childhood.  It resolved.  About a couple of days later, she noticed problems with memory and confusion.  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  She also had word-finding difficulties.  Other people thought she did not act like herself.  She seemed more quiet and withdrawn.  She denied lateralize weakness, visual disturbance or gait instability.  MRI of brain with and without contrast from 01/24/16 was personally reviewed and showed abnormal signal in the left basal ganglia, consistent with subacute infarct.  2D echo performed on 01/30/16 showed EF 60-65% with no evidence for cardiac source of emboli.  Carotid doppler showed no hemodynamically significant stenosis.  She still reports mild word-finding difficulties but otherwise she feels better.  CBC showed WBC 7.7, HGB 13.1, HCT 39.9 and PLT 313;  CMP showed Na 141, K 3.7, Cl 103, CO2 33, glucose 111, BUN 17, Cr 0.90, TB 0.6, ALP 66, AST 25 and ALT 26;  TSH 1.32.  She takes Lipitor 40mg  daily.  Lipid panel from 08/20/15 showed cholesterol 177, TG 233, HDL 42 and LDL 89.  Since discovery of stroke, she was started on ASA 81mg  daily.  She reports palpitations in the past, even this year, but not since the stroke. She denies family history of stroke.  She is a former smoker, for 3 or 4 years, but quit 30 years ago.  PAST MEDICAL HISTORY: Past Medical History:  Diagnosis Date  . Depression     . GERD (gastroesophageal reflux disease)   . Hypercholesterolemia   . Hypertension     PAST SURGICAL HISTORY: Past Surgical History:  Procedure Laterality Date  . NEPHROLITHOTOMY  04-1999   during pregnancy, left kidney  . WISDOM TOOTH EXTRACTION  1984    MEDICATIONS: Current Outpatient Prescriptions on File Prior to Visit  Medication Sig Dispense Refill  . atorvastatin (LIPITOR) 40 MG tablet Take 40 mg by mouth daily.    . Calcium Carb-Cholecalciferol (CALCIUM + D3 PO) Take by mouth. 600 mg    . lisinopril (PRINIVIL,ZESTRIL) 40 MG tablet Take 40 mg by mouth daily.   1  . Multiple Vitamin (MULTIVITAMIN) tablet Take 1 tablet by mouth daily.    . Omega-3 Fatty Acids (FISH OIL) 1000 MG CPDR Take by mouth 2 (two) times daily.    Marland Kitchen OVER THE COUNTER MEDICATION Glucosamine 4000mg  daily    . pantoprazole (PROTONIX) 40 MG tablet Take 40 mg by mouth daily.    Marland Kitchen spironolactone (ALDACTONE) 25 MG tablet Take 0.5 tablets (12.5 mg total) by mouth daily. 45 tablet 3  . venlafaxine XR (EFFEXOR-XR) 75 MG 24 hr capsule Take 75 mg by mouth daily with breakfast.    . metoprolol succinate (TOPROL-XL) 100 MG 24 hr tablet Take 50 mg by mouth daily.      No current facility-administered medications on file prior to visit.     ALLERGIES: No Known Allergies  FAMILY HISTORY: Family History  Problem Relation Age of Onset  .  Heart disease Mother   . Heart disease Father   . Colon cancer Neg Hx     SOCIAL HISTORY: Social History   Social History  . Marital status: Divorced    Spouse name: N/A  . Number of children: N/A  . Years of education: N/A   Occupational History  . Not on file.   Social History Main Topics  . Smoking status: Former Smoker    Types: Cigarettes  . Smokeless tobacco: Never Used  . Alcohol use Yes     Comment: SOCIALLY  . Drug use: No  . Sexual activity: Not on file   Other Topics Concern  . Not on file   Social History Narrative  . No narrative on file     REVIEW OF SYSTEMS: Constitutional: No fevers, chills, or sweats, no generalized fatigue, change in appetite Eyes: No visual changes, double vision, eye pain Ear, nose and throat: No hearing loss, ear pain, nasal congestion, sore throat Cardiovascular: No chest pain, palpitations Respiratory:  No shortness of breath at rest or with exertion, wheezes GastrointestinaI: No nausea, vomiting, diarrhea, abdominal pain, fecal incontinence Genitourinary:  No dysuria, urinary retention or frequency Musculoskeletal:  No neck pain, back pain Integumentary: No rash, pruritus, skin lesions Neurological: as above Psychiatric: No depression, insomnia, anxiety Endocrine: No palpitations, fatigue, diaphoresis, mood swings, change in appetite, change in weight, increased thirst Hematologic/Lymphatic:  No purpura, petechiae. Allergic/Immunologic: no itchy/runny eyes, nasal congestion, recent allergic reactions, rashes  PHYSICAL EXAM: Vitals:   03/02/16 0755  BP: (!) 136/94  Pulse: (!) 102   General: No acute distress.  Patient appears well-groomed.  Head:  Normocephalic/atraumatic Eyes:  fundi examined but not visualized Neck: supple, no paraspinal tenderness, full range of motion Back: No paraspinal tenderness Heart: regular rate and rhythm Lungs: Clear to auscultation bilaterally. Vascular: No carotid bruits. Neurological Exam: Mental status: alert and oriented to person, place, and time, recent and remote memory intact, fund of knowledge intact, attention and concentration intact, speech fluent and not dysarthric, language intact. Cranial nerves: CN I: not tested CN II: pupils equal, round and reactive to light, visual fields intact CN III, IV, VI:  full range of motion, no nystagmus, no ptosis CN V: facial sensation intact CN VII: upper and lower face symmetric CN VIII: hearing intact CN IX, X: gag intact, uvula midline CN XI: sternocleidomastoid and trapezius muscles intact CN XII:  tongue midline Bulk & Tone: normal, no fasciculations. Motor:  5/5 throughout  Sensation: temperature and vibration sensation intact. Deep Tendon Reflexes:  2+ throughout, toes downgoing.  Finger to nose testing:  Without dysmetria.  Heel to shin:  Without dysmetria.  Gait:  Normal station and stride.  Able to turn and tandem walk. Romberg negative.  IMPRESSION: Left basal ganglia stroke.  Cryptogenic.  Stroke involves the entire the basal ganglia, making small vessel disease less likely. Hypertension.   Hyperlipidemia  PLAN: 1.  Continue ASA 81mg  daily for secondary stroke prevention 2.  Will check MRA of head to look for any cerebral arterial occlusion that would correspond with the left basal ganglia. 3.  Will recheck lipid panel.  May need to increase Lipitor to 80mg  daily as LDL goal should be less than 70. 4.  She did report palpitations.  Will check 30 day Holter to evaluate for atrial fibrillation. 5.  BP mildly elevated.  She self-discontinued metoprolol because she felt it was not helpful.  Recommended that she contact Dr. [name] to optimize blood pressure control 6.  I would like to repeat MRI of brain with and without contrast in 3 weeks to ensure that findings consistent with evolution of stroke, since it involves the entire the basal ganglia. 7.  Follow up in 3 months.  Thank you for allowing me to take part in the care of this patient.  Metta Clines, DO  CC:  Gaynelle Arabian, MD

## 2016-03-02 NOTE — Patient Instructions (Addendum)
1.  Continue aspirin 81mg  daily for secondary stroke prevention 2.  We will check MRA of head.  I would like to repeat MRI of brain with and without contrast in about 3 weeks to look for any changes in the MRI. 3.  We will repeat fasting lipid panel and will check Hgb A1c 4.  I would like to get a Holter monitor to evaluate for arrhythmia which may cause stroke 5.  Follow up in 3 months.  Have blood pressure rechecked with Dr. [name].

## 2016-03-03 ENCOUNTER — Ambulatory Visit: Payer: 59 | Admitting: Neurology

## 2016-03-05 ENCOUNTER — Other Ambulatory Visit: Payer: Self-pay

## 2016-03-05 NOTE — Telephone Encounter (Signed)
Received form from [place] to call patient to find out what she needs FMLA for. Did prefill form and placed in provider's box. Provider having only seen patient once, and states pt has no side effects declines filling form out. Attempted to reach patient again. No answer. Will hold form at my desk until I can reach patient. (incomplete forms)

## 2016-03-08 MED FILL — PANTOPRAZOLE SOD DR 40 MG T: 40 | 90 days supply | Qty: 90 | Fill #2

## 2016-03-08 NOTE — Telephone Encounter (Signed)
Spoke with patient. Pt aware Dr. Tomi Likens will not fill out FMLA forms. Pt would like provider to fill out [employer] form for critical illness. It is for her supplement insurance.

## 2016-03-08 NOTE — Telephone Encounter (Signed)
Received 2nd FMLA form package. Attempted to reach pt again, left vm.

## 2016-03-22 ENCOUNTER — Other Ambulatory Visit: Payer: Self-pay | Admitting: Neurology

## 2016-03-22 DIAGNOSIS — R002 Palpitations: Secondary | ICD-10-CM

## 2016-03-22 DIAGNOSIS — I4891 Unspecified atrial fibrillation: Secondary | ICD-10-CM

## 2016-03-22 DIAGNOSIS — I639 Cerebral infarction, unspecified: Secondary | ICD-10-CM

## 2016-03-23 ENCOUNTER — Ambulatory Visit
Admission: RE | Admit: 2016-03-23 | Discharge: 2016-03-23 | Disposition: A | Discharge status: Home / Self Care | Payer: 59 | Source: Ambulatory Visit | Attending: Neurology | Admitting: Neurology

## 2016-03-23 ENCOUNTER — Telehealth: Payer: Self-pay

## 2016-03-23 ENCOUNTER — Ambulatory Visit (INDEPENDENT_AMBULATORY_CARE_PROVIDER_SITE_OTHER): Payer: 59

## 2016-03-23 DIAGNOSIS — I4891 Unspecified atrial fibrillation: Secondary | ICD-10-CM

## 2016-03-23 DIAGNOSIS — R002 Palpitations: Secondary | ICD-10-CM

## 2016-03-23 DIAGNOSIS — I639 Cerebral infarction, unspecified: Secondary | ICD-10-CM

## 2016-03-23 DIAGNOSIS — R410 Disorientation, unspecified: Secondary | ICD-10-CM | POA: Diagnosis not present

## 2016-03-23 DIAGNOSIS — I6523 Occlusion and stenosis of bilateral carotid arteries: Secondary | ICD-10-CM | POA: Diagnosis not present

## 2016-03-23 DIAGNOSIS — I729 Aneurysm of unspecified site: Secondary | ICD-10-CM

## 2016-03-23 MED ORDER — GADOBENATE DIMEGLUMINE 529 MG/ML IV SOLN
15.0000 mL | Freq: Once | INTRAVENOUS | Status: AC | PRN
  Administered 2016-03-23: 15 mL via INTRAVENOUS

## 2016-03-23 NOTE — Telephone Encounter (Signed)
-----   Message from Pieter Partridge, DO sent at 03/23/2016  1:43 PM EDT ----- The repeat MRI does confirm that she had a stroke.  Incidentally, there is an aneurysm which I would like evaluated by neuro-endovascular

## 2016-03-23 NOTE — Telephone Encounter (Signed)
Attempted to reach pt. No answer. Did leave message on pt's vm to return call. Did call [facility]. Asked for interventional Radiology was transferred to [facility] who told me to fax referral to [phone]. Order placed.

## 2016-03-24 NOTE — Telephone Encounter (Signed)
Message relayed to patient. Verbalized understanding and denied questions.   

## 2016-03-24 NOTE — Telephone Encounter (Signed)
Called both pt's cell and work numbers. Left message at pt's work to Return call to [facility] Neurology with Gay Filler.

## 2016-03-29 ENCOUNTER — Telehealth: Payer: Self-pay | Admitting: Neurology

## 2016-03-29 NOTE — Telephone Encounter (Signed)
PT called and said she has some questions regarding her MRI/Dawn [phone]

## 2016-03-29 NOTE — Telephone Encounter (Signed)
I spoke with patient and she was wondering when referral to IR was made.  Informed her that it was sent on the 22nd.  She said that she would call over there to check on status of the appointment.

## 2016-03-30 ENCOUNTER — Ambulatory Visit (HOSPITAL_COMMUNITY)
Admission: RE | Admit: 2016-03-30 | Discharge: 2016-03-30 | Disposition: A | Discharge status: Home / Self Care | Payer: 59 | Source: Ambulatory Visit | Attending: Neurology | Admitting: Neurology

## 2016-03-30 ENCOUNTER — Other Ambulatory Visit: Payer: Self-pay | Admitting: Neurology

## 2016-03-30 ENCOUNTER — Other Ambulatory Visit (HOSPITAL_COMMUNITY): Payer: Self-pay | Admitting: Interventional Radiology

## 2016-03-30 DIAGNOSIS — I63232 Cerebral infarction due to unspecified occlusion or stenosis of left carotid arteries: Secondary | ICD-10-CM | POA: Diagnosis not present

## 2016-03-30 DIAGNOSIS — I671 Cerebral aneurysm, nonruptured: Secondary | ICD-10-CM

## 2016-03-30 DIAGNOSIS — I6320 Cerebral infarction due to unspecified occlusion or stenosis of unspecified precerebral arteries: Secondary | ICD-10-CM

## 2016-03-30 HISTORY — PX: IR GENERIC HISTORICAL: IMG1180011

## 2016-03-31 ENCOUNTER — Other Ambulatory Visit: Payer: Self-pay | Admitting: Radiology

## 2016-03-31 ENCOUNTER — Other Ambulatory Visit: Payer: Self-pay | Admitting: General Surgery

## 2016-04-01 ENCOUNTER — Encounter (HOSPITAL_COMMUNITY): Payer: Self-pay | Admitting: Interventional Radiology

## 2016-04-02 ENCOUNTER — Ambulatory Visit (HOSPITAL_COMMUNITY)
Admission: RE | Admit: 2016-04-02 | Discharge: 2016-04-02 | Disposition: A | Discharge status: Home / Self Care | Payer: 59 | Source: Ambulatory Visit | Attending: Interventional Radiology | Admitting: Interventional Radiology

## 2016-04-02 ENCOUNTER — Encounter (HOSPITAL_COMMUNITY): Payer: Self-pay

## 2016-04-02 ENCOUNTER — Other Ambulatory Visit (HOSPITAL_COMMUNITY): Payer: Self-pay | Admitting: Interventional Radiology

## 2016-04-02 DIAGNOSIS — F329 Major depressive disorder, single episode, unspecified: Secondary | ICD-10-CM | POA: Insufficient documentation

## 2016-04-02 DIAGNOSIS — Z87891 Personal history of nicotine dependence: Secondary | ICD-10-CM | POA: Insufficient documentation

## 2016-04-02 DIAGNOSIS — I671 Cerebral aneurysm, nonruptured: Secondary | ICD-10-CM

## 2016-04-02 DIAGNOSIS — Z8249 Family history of ischemic heart disease and other diseases of the circulatory system: Secondary | ICD-10-CM | POA: Insufficient documentation

## 2016-04-02 DIAGNOSIS — I10 Essential (primary) hypertension: Secondary | ICD-10-CM | POA: Diagnosis not present

## 2016-04-02 DIAGNOSIS — I6522 Occlusion and stenosis of left carotid artery: Secondary | ICD-10-CM | POA: Diagnosis not present

## 2016-04-02 DIAGNOSIS — K219 Gastro-esophageal reflux disease without esophagitis: Secondary | ICD-10-CM | POA: Insufficient documentation

## 2016-04-02 DIAGNOSIS — E7800 Pure hypercholesterolemia, unspecified: Secondary | ICD-10-CM | POA: Insufficient documentation

## 2016-04-02 DIAGNOSIS — I63232 Cerebral infarction due to unspecified occlusion or stenosis of left carotid arteries: Secondary | ICD-10-CM | POA: Diagnosis not present

## 2016-04-02 DIAGNOSIS — I6320 Cerebral infarction due to unspecified occlusion or stenosis of unspecified precerebral arteries: Secondary | ICD-10-CM

## 2016-04-02 DIAGNOSIS — G43909 Migraine, unspecified, not intractable, without status migrainosus: Secondary | ICD-10-CM | POA: Insufficient documentation

## 2016-04-02 HISTORY — PX: IR GENERIC HISTORICAL: IMG1180011

## 2016-04-02 LAB — CBC
HEMATOCRIT: 39.3 % (ref 36.0–46.0)
HEMOGLOBIN: 12.8 g/dL (ref 12.0–15.0)
MCH: 29.2 pg (ref 26.0–34.0)
MCHC: 32.6 g/dL (ref 30.0–36.0)
MCV: 89.7 fL (ref 78.0–100.0)
Platelets: 255 10*3/uL (ref 150–400)
RBC: 4.38 MIL/uL (ref 3.87–5.11)
RDW: 13 % (ref 11.5–15.5)
WBC: 5.1 10*3/uL (ref 4.0–10.5)

## 2016-04-02 LAB — BASIC METABOLIC PANEL
Anion gap: 6 (ref 5–15)
BUN: 13 mg/dL (ref 6–20)
CHLORIDE: 106 mmol/L (ref 101–111)
CO2: 28 mmol/L (ref 22–32)
Calcium: 10 mg/dL (ref 8.9–10.3)
Creatinine, Ser: 0.87 mg/dL (ref 0.44–1.00)
GFR calc Af Amer: >60 mL/min (ref >60)
GFR calc non Af Amer: >60 mL/min (ref >60)
GLUCOSE: 102 mg/dL — AB (ref 65–99)
POTASSIUM: 3.9 mmol/L (ref 3.5–5.1)
SODIUM: 140 mmol/L (ref 135–145)

## 2016-04-02 LAB — PROTIME-INR
INR: 1.02
Prothrombin Time: 13.4 seconds (ref 11.4–15.2)

## 2016-04-02 LAB — APTT: aPTT: 27 seconds (ref 24–36)

## 2016-04-02 MED ORDER — FENTANYL CITRATE (PF) 100 MCG/2ML IJ SOLN
INTRAMUSCULAR | Status: AC
  Filled 2016-04-02: qty 2

## 2016-04-02 MED ORDER — HEPARIN SODIUM (PORCINE) 1000 UNIT/ML IJ SOLN
INTRAMUSCULAR | Status: AC
  Filled 2016-04-02: qty 2

## 2016-04-02 MED ORDER — SODIUM CHLORIDE 0.9 % IV SOLN: INTRAVENOUS | Status: AC

## 2016-04-02 MED ORDER — MIDAZOLAM HCL 2 MG/2ML IJ SOLN
INTRAMUSCULAR | Status: AC
  Filled 2016-04-02: qty 2

## 2016-04-02 MED ORDER — SODIUM CHLORIDE 0.9 % IV SOLN: Freq: Once | INTRAVENOUS | Status: DC

## 2016-04-02 MED ORDER — IOPAMIDOL (ISOVUE-300) INJECTION 61%
INTRAVENOUS | Status: AC
  Administered 2016-04-02: 75 mL
  Filled 2016-04-02: qty 150

## 2016-04-02 MED ORDER — HEPARIN SODIUM (PORCINE) 1000 UNIT/ML IJ SOLN
INTRAMUSCULAR | Status: AC | PRN
  Administered 2016-04-02: 1000 [IU] via INTRAVENOUS

## 2016-04-02 MED ORDER — LIDOCAINE HCL 1 % IJ SOLN
INTRAMUSCULAR | Status: AC
  Administered 2016-04-02: 10 mL
  Filled 2016-04-02: qty 20

## 2016-04-02 MED ORDER — FENTANYL CITRATE (PF) 100 MCG/2ML IJ SOLN
INTRAMUSCULAR | Status: AC | PRN
  Administered 2016-04-02: 25 ug via INTRAVENOUS

## 2016-04-02 MED ORDER — MIDAZOLAM HCL 2 MG/2ML IJ SOLN
INTRAMUSCULAR | Status: AC | PRN
  Administered 2016-04-02: 1 mg via INTRAVENOUS

## 2016-04-02 NOTE — Sedation Documentation (Signed)
ETCO2 removed per Dr. Deveshwar 

## 2016-04-02 NOTE — H&P (Signed)
Chief Complaint: recent CVA   Referring Physician:Dr. Metta Clines  Supervising Physician: Luanne Bras  Patient Status: Out-pt  HPI: Deborah Thompson is an 50 y.o. female who is otherwise fairly healthy has a history of migraines.  She took a day off in June due to a migraine.  A couple of days later her daughter noticed she was having word finding issues.  She does recall having a few moments around that time where she was thinking "funny."  She didn't pay attention to it.  Her daughter requested she see her PCP who ordered an MRI.  To his surprise she was noted to have had a CVA.  A follow up MRI was recommended in 6-8 weeks.  She was set up to see Dr. Tomi Likens, a neurologist, who has been following her.  The repeat MRI, then noticed a right para-opthalmic aneurysm.  He has referred her to Dr. Estanislado Pandy for evaluation.  He saw her earlier this week and has set her up for a diagnostic angiogram to further evaluate L ICA stenosis and a R para-opthalmic aneurysm.  She has no new complaints.  Past Medical History:  Past Medical History:  Diagnosis Date  . Depression   . GERD (gastroesophageal reflux disease)   . Hypercholesterolemia   . Hypertension     Past Surgical History:  Past Surgical History:  Procedure Laterality Date  . IR GENERIC HISTORICAL  03/30/2016   IR RADIOLOGIST EVAL & MGMT 03/30/2016 [facility]-INTERV RAD  . NEPHROLITHOTOMY  04-1999   during pregnancy, left kidney  . WISDOM TOOTH EXTRACTION  1984    Family History:  Family History  Problem Relation Age of Onset  . Heart disease Mother   . Heart disease Father   . Colon cancer Neg Hx     Social History:  reports that she has quit smoking. Her smoking use included Cigarettes. She has never used smokeless tobacco. She reports that she drinks alcohol. She reports that she does not use drugs.  Allergies: No Known Allergies  Medications: Medications reviewed in Epic  Please HPI for pertinent positives, otherwise  complete 10 system ROS negative.  Mallampati Score: MD Evaluation Airway: WNL Heart: WNL Abdomen: WNL Chest/ Lungs: WNL ASA  Classification: 2 Mallampati/Airway Score: Two  Physical Exam: BP 129/78   Pulse 60   Temp 98.3 F (36.8 C)   Resp 18   Ht '5\' 6"'$  (1.676 m)   Wt 175 lb (79.4 kg)   SpO2 100%   BMI 28.25 kg/m  Body mass index is 28.25 kg/m. General: pleasant, WD, WN white female who is laying in bed in NAD HEENT: head is normocephalic, atraumatic.  Sclera are noninjected.  PERRL.  Ears and nose without any masses or lesions.  Mouth is pink and moist. Wears glasses Heart: regular, rate, and rhythm.  Normal s1,s2. No obvious murmurs, gallops, or rubs noted.  Palpable radial and pedal pulses bilaterally.  Holter monitor in place Lungs: CTAB, no wheezes, rhonchi, or rales noted.  Respiratory effort nonlabored Abd: soft, NT, ND, +BS, no masses, hernias, or organomegaly MS: all 4 extremities are symmetrical with no cyanosis, clubbing, or edema. Psych: A&Ox3 with an appropriate affect.   Labs: Results for orders placed or performed during the hospital encounter of 04/02/16 (from the past 48 hour(s))  APTT     Status: None   Collection Time: 04/02/16  9:39 AM  Result Value Ref Range   aPTT 27 24 - 36 seconds  Basic metabolic panel  Status: Abnormal   Collection Time: 04/02/16  9:39 AM  Result Value Ref Range   Sodium 140 135 - 145 mmol/L   Potassium 3.9 3.5 - 5.1 mmol/L   Chloride 106 101 - 111 mmol/L   CO2 28 22 - 32 mmol/L   Glucose, Bld 102 (H) 65 - 99 mg/dL   BUN 13 6 - 20 mg/dL   Creatinine, Ser 0.87 0.44 - 1.00 mg/dL   Calcium 10.0 8.9 - 10.3 mg/dL   GFR calc non Af Amer >60 >60 mL/min   GFR calc Af Amer >60 >60 mL/min    Comment: (NOTE) The eGFR has been calculated using the CKD EPI equation. This calculation has not been validated in all clinical situations. eGFR's persistently <60 mL/min signify possible Chronic Kidney Disease.    Anion gap 6 5 - 15    CBC     Status: None   Collection Time: 04/02/16  9:39 AM  Result Value Ref Range   WBC 5.1 4.0 - 10.5 K/uL   RBC 4.38 3.87 - 5.11 MIL/uL   Hemoglobin 12.8 12.0 - 15.0 g/dL   HCT 39.3 36.0 - 46.0 %   MCV 89.7 78.0 - 100.0 fL   MCH 29.2 26.0 - 34.0 pg   MCHC 32.6 30.0 - 36.0 g/dL   RDW 13.0 11.5 - 15.5 %   Platelets 255 150 - 400 K/uL  Protime-INR     Status: None   Collection Time: 04/02/16  9:39 AM  Result Value Ref Range   Prothrombin Time 13.4 11.4 - 15.2 seconds   INR 1.02     Imaging: No results found.  Assessment/Plan 1. L ICA stenosis, R para-opthalmic aneurysm, recent CVA -we will plan to proceed with a diagnostic angiogram today to evaluate these areas of concern. -labs and vitals have been reviewed -determination for whether she will need interventional treatment will be made after this procedure today -Risks and Benefits discussed with the patient including, but not limited to bleeding, infection, vascular injury or contrast induced renal failure. All of the patient's questions were answered, patient is agreeable to proceed. Consent signed and in chart.  Thank you for this interesting consult.  I greatly enjoyed meeting JAPLEEN TORNOW and look forward to participating in their care.  A copy of this report was sent to the requesting provider on this date.  Electronically Signed: Henreitta Cea 04/02/2016, 11:09 AM   I spent a total of    25 Minutes in face to face in clinical consultation, greater than 50% of which was counseling/coordinating care for L ICA stenosis, R para-opthalmic aneurysm

## 2016-04-02 NOTE — Progress Notes (Signed)
Up and walked and tol well; right groin no bleeding or hematoma; dressing changed

## 2016-04-02 NOTE — Sedation Documentation (Signed)
5 fr. Exoseal to right groin 

## 2016-04-02 NOTE — Discharge Instructions (Signed)

## 2016-04-02 NOTE — Procedures (Signed)
S/P 4 vessel cerebral arteriogram. RT CFA approach. Findings. 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

## 2016-04-06 ENCOUNTER — Other Ambulatory Visit (HOSPITAL_COMMUNITY): Payer: Self-pay | Admitting: Interventional Radiology

## 2016-04-06 DIAGNOSIS — I729 Aneurysm of unspecified site: Secondary | ICD-10-CM

## 2016-04-07 ENCOUNTER — Encounter (HOSPITAL_COMMUNITY): Payer: Self-pay | Admitting: Interventional Radiology

## 2016-04-12 ENCOUNTER — Ambulatory Visit (HOSPITAL_COMMUNITY): Admission: RE | Admit: 2016-04-12 | Payer: 59 | Source: Ambulatory Visit

## 2016-04-13 ENCOUNTER — Ambulatory Visit (HOSPITAL_COMMUNITY)
Admission: RE | Admit: 2016-04-13 | Discharge: 2016-04-13 | Disposition: A | Discharge status: Home / Self Care | Payer: 59 | Source: Ambulatory Visit | Attending: Interventional Radiology | Admitting: Interventional Radiology

## 2016-04-13 DIAGNOSIS — R51 Headache: Secondary | ICD-10-CM | POA: Diagnosis not present

## 2016-04-13 DIAGNOSIS — I729 Aneurysm of unspecified site: Secondary | ICD-10-CM

## 2016-04-13 HISTORY — PX: IR GENERIC HISTORICAL: IMG1180011

## 2016-04-13 MED FILL — CLOPIDOGREL 75 MG TABLET: 75 | 30 days supply | Qty: 30 | Fill #0

## 2016-04-16 ENCOUNTER — Encounter (HOSPITAL_COMMUNITY): Payer: Self-pay | Admitting: Interventional Radiology

## 2016-04-19 ENCOUNTER — Telehealth (HOSPITAL_COMMUNITY): Payer: Self-pay | Admitting: *Deleted

## 2016-04-19 ENCOUNTER — Other Ambulatory Visit: Payer: Self-pay | Admitting: Radiology

## 2016-04-19 ENCOUNTER — Other Ambulatory Visit (HOSPITAL_COMMUNITY): Payer: Self-pay | Admitting: Interventional Radiology

## 2016-04-19 DIAGNOSIS — I671 Cerebral aneurysm, nonruptured: Secondary | ICD-10-CM

## 2016-04-19 LAB — PLATELET INHIBITION P2Y12: Platelet Function  P2Y12: 73 [PRU] — ABNORMAL LOW (ref 194–418)

## 2016-04-19 MED FILL — LISINOPRIL 40 MG TABLET: 40 | 90 days supply | Qty: 90 | Fill #1

## 2016-04-19 MED FILL — SPIRONOLACTONE 25 MG TABLET: 25 | 90 days supply | Qty: 45 | Fill #2

## 2016-04-19 NOTE — Telephone Encounter (Signed)
Called and spoke with patient.  Per Dr. Estanislado Pandy pt is to continue taking Plavix 75mg  daily and ASA 325mg  daily .  Pt verballized understanding

## 2016-04-20 ENCOUNTER — Other Ambulatory Visit: Payer: Self-pay | Admitting: Radiology

## 2016-04-23 NOTE — Progress Notes (Signed)
Several unsuccessful attempts were made to contact pt. No answer at work extension after 6:00PM. Left voice message on pt phone according to pre-op checklist. Pt made aware to stop vitamins, fish oil, herbal medications and NSAIDS such as Ibuprofen, Motrin, Advil , Naproxen ( Aleve) BC and Goody Powder.

## 2016-04-26 ENCOUNTER — Ambulatory Visit (HOSPITAL_COMMUNITY): Payer: 59 | Admitting: Anesthesiology

## 2016-04-26 ENCOUNTER — Encounter (HOSPITAL_COMMUNITY): Payer: Self-pay | Admitting: *Deleted

## 2016-04-26 ENCOUNTER — Ambulatory Visit (HOSPITAL_COMMUNITY)
Admission: RE | Admit: 2016-04-26 | Discharge: 2016-04-26 | Disposition: A | Discharge status: Home / Self Care | Payer: 59 | Source: Ambulatory Visit | Attending: Interventional Radiology | Admitting: Interventional Radiology

## 2016-04-26 ENCOUNTER — Encounter (HOSPITAL_COMMUNITY)
Admission: RE | Disposition: A | Discharge status: Home / Self Care | Payer: Self-pay | Source: Ambulatory Visit | Attending: Interventional Radiology

## 2016-04-26 ENCOUNTER — Encounter (HOSPITAL_COMMUNITY): Payer: Self-pay

## 2016-04-26 DIAGNOSIS — I671 Cerebral aneurysm, nonruptured: Secondary | ICD-10-CM | POA: Insufficient documentation

## 2016-04-26 DIAGNOSIS — Z538 Procedure and treatment not carried out for other reasons: Secondary | ICD-10-CM | POA: Diagnosis not present

## 2016-04-26 DIAGNOSIS — Z87891 Personal history of nicotine dependence: Secondary | ICD-10-CM | POA: Diagnosis not present

## 2016-04-26 DIAGNOSIS — F329 Major depressive disorder, single episode, unspecified: Secondary | ICD-10-CM | POA: Diagnosis not present

## 2016-04-26 DIAGNOSIS — E7800 Pure hypercholesterolemia, unspecified: Secondary | ICD-10-CM | POA: Diagnosis not present

## 2016-04-26 DIAGNOSIS — Z79899 Other long term (current) drug therapy: Secondary | ICD-10-CM | POA: Diagnosis not present

## 2016-04-26 DIAGNOSIS — K219 Gastro-esophageal reflux disease without esophagitis: Secondary | ICD-10-CM | POA: Diagnosis not present

## 2016-04-26 DIAGNOSIS — Z7902 Long term (current) use of antithrombotics/antiplatelets: Secondary | ICD-10-CM | POA: Diagnosis not present

## 2016-04-26 DIAGNOSIS — Z7982 Long term (current) use of aspirin: Secondary | ICD-10-CM | POA: Diagnosis not present

## 2016-04-26 DIAGNOSIS — I10 Essential (primary) hypertension: Secondary | ICD-10-CM | POA: Diagnosis not present

## 2016-04-26 DIAGNOSIS — Z8673 Personal history of transient ischemic attack (TIA), and cerebral infarction without residual deficits: Secondary | ICD-10-CM | POA: Diagnosis not present

## 2016-04-26 HISTORY — DX: Cerebral infarction, unspecified: I63.9

## 2016-04-26 HISTORY — DX: Migraine, unspecified, not intractable, without status migrainosus: G43.909

## 2016-04-26 HISTORY — DX: Cardiomyopathy, unspecified: I42.9

## 2016-04-26 HISTORY — DX: Unspecified pre-eclampsia, unspecified trimester: O14.90

## 2016-04-26 HISTORY — DX: Personal history of urinary calculi: Z87.442

## 2016-04-26 HISTORY — DX: Unspecified malignant neoplasm of skin, unspecified: C44.90

## 2016-04-26 HISTORY — DX: Personal history of gestational diabetes: Z86.32

## 2016-04-26 HISTORY — PX: RADIOLOGY WITH ANESTHESIA: SHX6223

## 2016-04-26 LAB — COMPREHENSIVE METABOLIC PANEL
ALT: 18 U/L (ref 14–54)
AST: 18 U/L (ref 15–41)
Albumin: 3.6 g/dL (ref 3.5–5.0)
Alkaline Phosphatase: 50 U/L (ref 38–126)
Anion gap: 10 (ref 5–15)
BUN: 12 mg/dL (ref 6–20)
CO2: 24 mmol/L (ref 22–32)
Calcium: 9.1 mg/dL (ref 8.9–10.3)
Chloride: 105 mmol/L (ref 101–111)
Creatinine, Ser: 0.73 mg/dL (ref 0.44–1.00)
GFR calc Af Amer: >60 mL/min (ref >60)
GFR calc non Af Amer: >60 mL/min (ref >60)
Glucose, Bld: 109 mg/dL — ABNORMAL HIGH (ref 65–99)
Potassium: 3.8 mmol/L (ref 3.5–5.1)
Sodium: 139 mmol/L (ref 135–145)
Total Bilirubin: 0.4 mg/dL (ref 0.3–1.2)
Total Protein: 6.4 g/dL — ABNORMAL LOW (ref 6.5–8.1)

## 2016-04-26 LAB — CBC WITH DIFFERENTIAL/PLATELET
Basophils Absolute: 0 10*3/uL (ref 0.0–0.1)
Basophils Relative: 1 %
Eosinophils Absolute: 0.2 10*3/uL (ref 0.0–0.7)
Eosinophils Relative: 3 %
HCT: 35.5 % — ABNORMAL LOW (ref 36.0–46.0)
Hemoglobin: 11.5 g/dL — ABNORMAL LOW (ref 12.0–15.0)
Lymphocytes Relative: 29 %
Lymphs Abs: 1.8 10*3/uL (ref 0.7–4.0)
MCH: 28.3 pg (ref 26.0–34.0)
MCHC: 32.4 g/dL (ref 30.0–36.0)
MCV: 87.4 fL (ref 78.0–100.0)
Monocytes Absolute: 0.6 10*3/uL (ref 0.1–1.0)
Monocytes Relative: 9 %
Neutro Abs: 3.8 10*3/uL (ref 1.7–7.7)
Neutrophils Relative %: 58 %
Platelets: 197 10*3/uL (ref 150–400)
RBC: 4.06 MIL/uL (ref 3.87–5.11)
RDW: 13.6 % (ref 11.5–15.5)
WBC: 6.4 10*3/uL (ref 4.0–10.5)

## 2016-04-26 LAB — PLATELET INHIBITION P2Y12: PLATELET FUNCTION P2Y12: 141 [PRU] — AB (ref 194–418)

## 2016-04-26 LAB — PROTIME-INR
INR: 1.01
Prothrombin Time: 13.3 seconds (ref 11.4–15.2)

## 2016-04-26 LAB — APTT: aPTT: 28 seconds (ref 24–36)

## 2016-04-26 SURGERY — RADIOLOGY WITH ANESTHESIA
Anesthesia: General

## 2016-04-26 NOTE — Progress Notes (Signed)
  Patient ID: Deborah Thompson, female   DOB: [birth year], 50 y.o.   MRN: [id number]   Scheduled today for R ICA/paraophthalmic artery aneurysm embolization  P2y12 machine not working at [facility] Dr Estanislado Pandy has cancelled appt for today  Will be rescheduled at pts convenience and availability of MD and anesthesia

## 2016-04-26 NOTE — Anesthesia Preprocedure Evaluation (Addendum)
Anesthesia Evaluation  Patient identified by MRN, date of birth, ID band Patient awake    Reviewed: Allergy & Precautions, NPO status , Patient's Chart, lab work & pertinent test results  Airway Mallampati: II  TM Distance: >3 FB Neck ROM: Full    Dental  (+) Teeth Intact, Dental Advisory Given   Pulmonary former smoker,  breath sounds clear to auscultation        Cardiovascular hypertension, Rhythm:Regular Rate:Normal     Neuro/Psych    GI/Hepatic   Endo/Other    Renal/GU      Musculoskeletal   Abdominal   Peds  Hematology   Anesthesia Other Findings   Reproductive/Obstetrics                             Anesthesia Physical Anesthesia Plan  ASA: III  Anesthesia Plan: General   Post-op Pain Management:    Induction: Intravenous  Airway Management Planned: Oral ETT  Additional Equipment: Arterial line  Intra-op Plan:   Post-operative Plan: Extubation in OR  Informed Consent: I have reviewed the patients History and Physical, chart, labs and discussed the procedure including the risks, benefits and alternatives for the proposed anesthesia with the patient or authorized representative who has indicated his/her understanding and acceptance.   Dental advisory given  Plan Discussed with: CRNA and Anesthesiologist  Anesthesia Plan Comments:         Anesthesia Quick Evaluation  

## 2016-04-26 NOTE — H&P (Signed)
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 Approximately 50% stenosis of the proximal left ICA supraclinoid segment. Small saccular outpouching arising from the posterior wall of the left internal carotid artery distal to the origin of the ophthalmic artery. The angiographic findings were reviewed with the patient and the patient's family. The patient has been scheduled to be seen in the clinic for consultation regarding management of her right internal carotid artery intracranial aneurysm as described above  Scheduled now for R ICA/paraophthalmic artery aneurysm embolization   Past Medical History:  Diagnosis Date  . Cardiomyopathy [facility])    after son was born  . Depression   . GERD (gastroesophageal reflux disease)   . History of gestational diabetes   . History of kidney stones   . Hypercholesterolemia   . Hypertension   . Migraines   . Preeclampsia   . Skin cancer   . Stroke [facility])    reports that she had difficulty in making words    Past Surgical History:  Procedure Laterality Date  . IR GENERIC HISTORICAL  03/30/2016   IR RADIOLOGIST EVAL & MGMT 03/30/2016 [facility]  . IR GENERIC HISTORICAL  04/02/2016   IR ANGIO INTRA EXTRACRAN SEL COM  CAROTID INNOMINATE BILAT MOD SED 04/02/2016 Luanne Bras, MD [facility]  . IR GENERIC HISTORICAL  04/02/2016   IR ANGIO VERTEBRAL SEL VERTEBRAL BILAT MOD SED 04/02/2016 Luanne Bras, MD [facility]  . IR GENERIC HISTORICAL  04/13/2016   IR RADIOLOGIST EVAL & MGMT 04/13/2016 [facility]  . NEPHROLITHOTOMY  04-1999   during pregnancy, left kidney  . skin cancer removal    . WISDOM TOOTH EXTRACTION  1984    Allergies: Review of patient's allergies indicates no known allergies.  Medications: Prior to Admission medications   Medication Sig Start Date End Date Taking? Authorizing Provider  aspirin (EC-81 ASPIRIN) 81 MG EC tablet 1 tablet 01/26/16  Yes Historical Provider, MD  atorvastatin (LIPITOR) 40 MG tablet Take 40 mg by mouth daily.   Yes Historical Provider, MD  Calcium Carb-Cholecalciferol (CALCIUM + D3 PO) Take 600 mg elemental calcium/kg/hr by mouth 2 (two) times daily. 600 mg    Yes Historical Provider, MD  clopidogrel (PLAVIX) 75 MG tablet Take 75 mg by mouth daily.   Yes Historical Provider, MD  Glucosamine HCl 1000 MG TABS Take 2 tablets by mouth daily.   Yes Historical Provider, MD  levonorgestrel (MIRENA, 52 MG,) 20 MCG/24HR IUD 1 each by Intrauterine route once.   Yes Historical Provider, MD  lisinopril (PRINIVIL,ZESTRIL) 40 MG tablet Take 40 mg by mouth daily.  10/13/15  Yes Historical Provider, MD  metoprolol succinate (TOPROL-XL) 100 MG 24 hr tablet Take 50 mg by mouth every evening.  02/26/15  Yes Historical Provider, MD  Multiple Vitamin (MULTIVITAMIN) tablet Take 1 tablet by mouth daily.   Yes Historical  Provider, MD  Omega-3 Fatty Acids (FISH OIL) 1000 MG CPDR Take 1 capsule by mouth 2 (two) times daily.    Yes Historical Provider, MD  pantoprazole (PROTONIX) 40 MG tablet Take 40 mg by mouth daily.   Yes Historical Provider, MD  spironolactone (ALDACTONE) 25 MG tablet Take 0.5 tablets (12.5 mg total) by mouth daily. 11/03/15  Yes Jerline Pain, MD  venlafaxine XR  (EFFEXOR-XR) 75 MG 24 hr capsule Take 75 mg by mouth daily with breakfast.   Yes Historical Provider, MD  aspirin 325 MG tablet Take 325 mg by mouth daily. Patient reports that she started taking this on 04/24/16    Historical Provider, MD     Family History  Problem Relation Age of Onset  . Heart disease Mother   . Heart disease Father   . Colon cancer Neg Hx     Social History   Social History  . Marital status: Divorced    Spouse name: N/A  . Number of children: N/A  . Years of education: N/A   Social History Main Topics  . Smoking status: Former Smoker    Packs/day: 1.00    Years: 4.00    Types: Cigarettes  . Smokeless tobacco: Never Used  . Alcohol use Yes     Comment: SOCIALLY  . Drug use: No  . Sexual activity: Not Asked   Other Topics Concern  . None   Social History Narrative  . None     Review of Systems: A 12 point ROS discussed and pertinent positives are indicated in the HPI above.  All other systems are negative.  Review of Systems  Constitutional: Positive for fatigue. Negative for activity change and fever.  HENT: Negative for tinnitus, trouble swallowing and voice change.   Eyes: Positive for visual disturbance.  Respiratory: Negative for cough and shortness of breath.   Cardiovascular: Negative for chest pain.  Gastrointestinal: Negative for abdominal pain.  Musculoskeletal: Negative for gait problem.  Neurological: Negative for dizziness, tremors, seizures, syncope, facial asymmetry, speech difficulty, weakness, light-headedness, numbness and headaches.  Psychiatric/Behavioral: Negative for behavioral problems and confusion.    Vital Signs: There were no vitals taken for this visit.  Physical Exam  Constitutional: She is oriented to person, place, and time. She appears well-nourished.  HENT:  Head: Atraumatic.  Eyes: EOM are normal.  Neck: Neck supple.  Cardiovascular: Normal rate, regular rhythm and normal heart sounds.   No murmur  heard. Pulmonary/Chest: Effort normal and breath sounds normal. She has no wheezes.  Abdominal: Soft. Bowel sounds are normal. There is no tenderness.  Musculoskeletal: Normal range of motion. She exhibits no edema.  Neurological: She is alert and oriented to person, place, and time.  Skin: Skin is warm and dry.  Psychiatric: She has a normal mood and affect. Her behavior is normal. Judgment and thought content normal.    Mallampati Score:  MD Evaluation Airway: WNL Heart: WNL Abdomen: WNL Chest/ Lungs: WNL ASA  Classification: 2 Mallampati/Airway Score: Two  Imaging: Ir Radiologist Eval & Mgmt  Result Date: 04/16/2016 EXAM: ESTABLISHED PATIENT OFFICE VISIT CHIEF COMPLAINT: Persistent headache. Recent history of right internal carotid artery paraophthalmic region aneurysm. Current Pain Level: 1-10 HISTORY OF PRESENT ILLNESS: The patient is a 50 year old right-handed lady who returns today following her diagnostic catheter angiogram on 04/02/2016. The patient is here to discuss the management of the bilobed right paraophthalmic region aneurysm that was confirmed on the arteriogram. The arteriogram also showed an approximately 50% stenoses of  the left internal carotid artery proximal supraclinoid segment. The findings on the catheter angiogram were reviewed with the patient. Brought to her attention was the bilobed right internal carotid artery paraophthalmic region aneurysm just distal to the origin of the ophthalmic artery. The aneurysm measured 5.7 mm x 4.5 mm with a moderate sized neck. The natural history of unruptured intracranial aneurysms was reviewed with the patient. The risk of rupture of 1-2% with the attendant consequences of severe morbidity and mortality were all reviewed. Risks of rupture were reviewed and discussed in detail. These include her female gender, family history of harboring unruptured intracranial aneurysms, smoking, hypertension, hyperlipidemia and diabetes. Options  regarding management were those of continued close surveillance every six months to a year with MRI MRAs versus consideration of exclusion of the aneurysm from the intracranial circulation. The latter would entail either surgical clipping versus endovascular treatment either with primary coiling versus stent assisted coiling versus flow diversion device with coil assistance. Although the procedures were reviewed in detail, potential benefits and risks were reviewed also. The patient wants to pursue endovascular treatment with elimination of the aneurysm from the circulation. The patient wants to proceed and schedule the treatment under anesthesia. Seven days prior to the procedure, the patient will be started on aspirin 325 mg a day and Plavix 75 mg a day. These will be monitored with P2Y12 platelet inhibition test. The reason for this was explained to the patient. Also should a flow diversion device be used, she was informed of a small risk of delayed hemorrhage 2-4 weeks following the procedure and attempts to mitigate that with close monitoring of her anti-platelet therapy, and her high blood pressure. Questions were answered to her satisfaction. The patient was asked to call should she have any concerns or questions. The procedure will therefore be scheduled as soon as possible. In the meantime the patient was asked to continue monitoring her blood pressure and to take her antihypertensive medications meticulously. The patient denies smoking, or any known family history of unruptured or ruptured brain aneurysms. PHYSICAL EXAMINATION: Deferred. ASSESSMENT AND PLAN: See discussion as above. Electronically Signed   By: Luanne Bras M.D.   On: 04/14/2016 07:51   Ir Radiologist Eval & Mgmt  Result Date: 04/01/2016 EXAM: NEW PATIENT OFFICE VISIT CHIEF COMPLAINT: Transient word finding difficulties. Discovery of unruptured intracranial aneurysm, and suspicion of a high-grade stenosis of the left internal  carotid artery in the proximal supraclinoid left ICA. Current Pain Level: HISTORY OF PRESENT ILLNESS: The patient is a 50 year old right-handed lady who has been referred for evaluation of treatment of a suspected left internal carotid artery stenoses intracranially, and also of an incidental right internal carotid artery.intracranial para-ophthalmic region aneurysm. Basically the patient had symptoms of expressive aphasia and confusion which lasted a day or two witnessed. This was in June of this year. Subsequent workup revealed left cerebral hemispheric subcortical ischemia involving the left centrum semiovale. Workup also at that time revealed the MRA changes as described above. The patient was treated conservatively and has since made almost complete recovery and returned to work. At the present time she reports only rare occasions when she has difficulty finding words. She denies any headaches, nausea, vomiting, visual aberrations, right-sided numbness, tingling, weakness, incoordination or station and gait difficulties. She is back at work and able to work on a computer without any difficulty. Past Medical History: Depression. Gastroesophageal reflux, hypercholesterolemia, hypertension. 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 Wisdom tooth  extraction. Left-sided shin skin cancer treatment in 2016. Medications: Aspirin 81 mg a day. Atorvastatin. Lisinopril. Metoprolol. Protonix. Spironolactone. Venlafaxine antidepressant. Calcium supplements b.i.d. Fish oil. Glucosamine and multi vitamins. Allergies: No known allergies. Social History: The patient is divorced, has 2 kids alive and well. She is a Environmental consultant. She denies smoking cigarettes or drinking alcohol. Denies the use of illicit chemicals. Family History: Significant for mother having died of an MI at age 84. Father died at age 5 of heart failure. Uncle with lung disease. No history  of intracranial aneurysms as far as is known. REVIEW OF SYSTEMS: Essentially negative for pathologic symptomatology. PHYSICAL EXAMINATION: In no acute distress. Affect normal. Neurologically intact without lateralizing cranial nerve abnormalities, motor, sensory or station and gait abnormalities. ASSESSMENT AND PLAN: The patient's recent MRA examination and MRI examination was reviewed with her. The area of ischemia with evolving encephalomalacic changes involving the left centrum semivale was brought to her attention. Also brought to her attention was the right para-ophthalmic intracranial aneurysm and suggestion of a possible high-grade stenoses involving the left internal carotid artery proximal supraclinoid segment. The natural history of unruptured intracranial aneurysms was reviewed. Risk of rupture of 1-2% per year with increased risk of rupture and enlargement associated with female gender, smoking, high blood pressure, hypercholesterolemia, and smoking were all reviewed. Also reviewed was the possibly of the suspected intracranial stenosis involving the left intracranial supraclinoid segment of the left internal carotid artery. It was felt that a formal diagnostic catheter angiogram to better evaluate the suspected abnormality intracranially would be appropriate. The procedure, risks, benefits and alternatives were all reviewed in detail. Questions were answered to her satisfaction. The patient is agreeable to proceed with a diagnostic catheter angiogram which will be scheduled as soon as possible. The patient was asked to call should she have any concerns or questions. Electronically Signed   By: Luanne Bras M.D.   On: 04/01/2016 13:47   Ir Angio Intra Extracran Sel Com Carotid Innominate Bilat Mod Sed  Result Date: 04/07/2016 CLINICAL DATA:  Left cerebral hemispheric ischemic stroke. Abnormal MRA of the brain. EXAM: BILATERAL COMMON CAROTID AND INNOMINATE ANGIOGRAPHY AND BILATERAL VERTEBRAL  ARTERY ANGIOGRAMS PROCEDURE: Contrast: 60 mL ISOVUE-300 IOPAMIDOL (ISOVUE-300) INJECTION 61% Anesthesia/Sedation:  Conscious sedation. Medications: Versed 1 mg IV. Fentanyl 25 mcg IV. Heparin 1000 units IV. Following a full explanation of the procedure along with the potential associated complications, an informed witnessed consent was obtained. The right groin was prepped and draped in the usual sterile fashion. Thereafter using modified Seldinger technique, transfemoral access into the right common femoral artery was obtained without difficulty. Over a 0.035 inch guidewire, a 5 French Pinnacle sheath was inserted. Through this, and also over 0.035 inch guidewire, a 5 [place] JB 1 catheter was advanced to the aortic arch region and selectively positioned in the right common carotid artery, the right vertebral artery, the left common carotid artery and the left vertebral artery. There were no acute complications. The patient tolerated the procedure well. FINDINGS: The right common carotid arteriogram demonstrates the right external carotid artery and its major branches to be widely patent. The right internal carotid artery at the bulb and its proximal one-third is widely patent. The middle one-third has mild tortuosity associated with fusiform dilatation. Distal to this the right internal carotid artery is seen to opacify normally to the cranial skull base. The petrous, the cavernous and the supraclinoid segments are widely patent. The right middle cerebral artery and the right anterior cerebral artery  opacify normally into the capillary and venous phases. Arising from the right lateral wall of the right internal carotid artery distal to the origin of the ophthalmic artery is a wide neck saccular bilobed aneurysm. This measures approximately 5.8 mm x 5 mm. The right middle cerebral artery and the right anterior cerebral artery opacify normally into the capillary and venous phases. The right vertebral artery origin is  normal. The vessel is seen to opacify normally to the cranial skull base. Normal opacification is seen of the right vertebrobasilar junction and the right posterior-inferior cerebellar artery. The basilar artery, the posterior cerebral arteries, superior cerebellar arteries and the anterior-inferior cerebellar arteries opacify normally into the capillary and venous phases. Non-opacified blood is seen in the basilar artery from the contralateral vertebral artery. Also demonstrated is transient retrograde opacification via both posterior communicating arteries into their respective internal carotid arteries. A duplicated origin of the superior cerebellar arteries is a normal developmental variation. The left common carotid arteriogram demonstrates the left external carotid artery and its major branches to be widely patent. The left internal carotid artery at the bulb to the cranial skull base opacifies normally. The petrous segment is normal. There is a 50% stenosis of the proximal supraclinoid right ICA associated mild focal wide-based saccular dilatation. This is distal to the origin of the ophthalmic artery. The supraclinoid left ICA distally is widely patent. The left middle cerebral artery and the left anterior cerebral artery opacify normally into the capillary and venous phases. The left subclavian arteriogram demonstrates normal opacification of the left thyrocervical trunk without reconstitution of the vertebral artery from the ascending cervical branch. The left vertebral artery origin is from the aortic arch between the origin of the left common carotid artery and left subclavian arteries. The vessel, otherwise, is seen to opacify normally to the cranial skull base. Normal opacification is seen of the left posterior-inferior cerebellar artery and left vertebrobasilar junction. The basilar artery, the posterior cerebral arteries, superior cerebellar arteries and the anterior-inferior cerebellar arteries  opacify normally into the capillary and venous phases. A prominent posterior meningeal artery is noted running in the occipital groove and centrally over the occipital bone. 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 Approximately 50% stenosis of the proximal left ICA supraclinoid segment. Small saccular outpouching arising from the posterior wall of the left internal carotid artery distal to the origin of the ophthalmic artery. The angiographic findings were reviewed with the patient and the patient's family. The patient has been scheduled to be seen in the clinic for consultation regarding management of her right internal carotid artery intracranial aneurysm as described above. Electronically Signed   By: Luanne Bras M.D.   On: 04/06/2016 19:12   Ir Angio Vertebral Sel Vertebral Bilat Mod Sed  Result Date: 04/07/2016 CLINICAL DATA:  Left cerebral hemispheric ischemic stroke. Abnormal MRA of the brain. EXAM: BILATERAL COMMON CAROTID AND INNOMINATE ANGIOGRAPHY AND BILATERAL VERTEBRAL ARTERY ANGIOGRAMS PROCEDURE: Contrast: 60 mL ISOVUE-300 IOPAMIDOL (ISOVUE-300) INJECTION 61% Anesthesia/Sedation:  Conscious sedation. Medications: Versed 1 mg IV. Fentanyl 25 mcg IV. Heparin 1000 units IV. Following a full explanation of the procedure along with the potential associated complications, an informed witnessed consent was obtained. The right groin was prepped and draped in the usual sterile fashion. Thereafter using modified Seldinger technique, transfemoral access into the right common femoral artery was obtained without difficulty. Over a 0.035  inch guidewire, a 5 French Pinnacle sheath was inserted. Through this, and also over 0.035 inch guidewire, a 5 [place] JB 1 catheter was advanced to the aortic arch region and selectively positioned in the right common carotid  artery, the right vertebral artery, the left common carotid artery and the left vertebral artery. There were no acute complications. The patient tolerated the procedure well. FINDINGS: The right common carotid arteriogram demonstrates the right external carotid artery and its major branches to be widely patent. The right internal carotid artery at the bulb and its proximal one-third is widely patent. The middle one-third has mild tortuosity associated with fusiform dilatation. Distal to this the right internal carotid artery is seen to opacify normally to the cranial skull base. The petrous, the cavernous and the supraclinoid segments are widely patent. The right middle cerebral artery and the right anterior cerebral artery opacify normally into the capillary and venous phases. Arising from the right lateral wall of the right internal carotid artery distal to the origin of the ophthalmic artery is a wide neck saccular bilobed aneurysm. This measures approximately 5.8 mm x 5 mm. The right middle cerebral artery and the right anterior cerebral artery opacify normally into the capillary and venous phases. The right vertebral artery origin is normal. The vessel is seen to opacify normally to the cranial skull base. Normal opacification is seen of the right vertebrobasilar junction and the right posterior-inferior cerebellar artery. The basilar artery, the posterior cerebral arteries, superior cerebellar arteries and the anterior-inferior cerebellar arteries opacify normally into the capillary and venous phases. Non-opacified blood is seen in the basilar artery from the contralateral vertebral artery. Also demonstrated is transient retrograde opacification via both posterior communicating arteries into their respective internal carotid arteries. A duplicated origin of the superior cerebellar arteries is a normal developmental variation. The left common carotid arteriogram demonstrates the left external carotid artery and  its major branches to be widely patent. The left internal carotid artery at the bulb to the cranial skull base opacifies normally. The petrous segment is normal. There is a 50% stenosis of the proximal supraclinoid right ICA associated mild focal wide-based saccular dilatation. This is distal to the origin of the ophthalmic artery. The supraclinoid left ICA distally is widely patent. The left middle cerebral artery and the left anterior cerebral artery opacify normally into the capillary and venous phases. The left subclavian arteriogram demonstrates normal opacification of the left thyrocervical trunk without reconstitution of the vertebral artery from the ascending cervical branch. The left vertebral artery origin is from the aortic arch between the origin of the left common carotid artery and left subclavian arteries. The vessel, otherwise, is seen to opacify normally to the cranial skull base. Normal opacification is seen of the left posterior-inferior cerebellar artery and left vertebrobasilar junction. The basilar artery, the posterior cerebral arteries, superior cerebellar arteries and the anterior-inferior cerebellar arteries opacify normally into the capillary and venous phases. A prominent posterior meningeal artery is noted running in the occipital groove and centrally over the occipital bone. 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 Approximately 50% stenosis of the proximal left ICA supraclinoid segment. Small saccular outpouching arising from the posterior wall of the left internal carotid artery distal to the origin of the ophthalmic artery. The angiographic findings were reviewed with the patient and the patient's family. The patient has been scheduled to be  seen in the clinic for consultation regarding management of her right internal carotid artery  intracranial aneurysm as described above. Electronically Signed   By: Luanne Bras M.D.   On: 04/06/2016 19:12    Labs:  CBC:  Recent Labs  04/02/16 0939 04/26/16 0642  WBC 5.1 6.4  HGB 12.8 11.5*  HCT 39.3 35.5*  PLT 255 197    COAGS:  Recent Labs  04/02/16 0939 04/26/16 0642  INR 1.02 1.01  APTT 27 28    BMP:  Recent Labs  11/10/15 1344 01/24/16 1118 03/02/16 0916 04/02/16 0939 04/26/16 0642  NA 139  --  141 140 139  K 3.9  --  3.8 3.9 3.8  CL 101  --  105 106 105  CO2 29  --  29 28 24   GLUCOSE 92  --  105* 102* 109*  BUN 11  --  16 13 12   CALCIUM 10.2  --  9.6 10.0 9.1  CREATININE 0.64 0.80 0.78 0.87 0.73  GFRNONAA  --   --   --  >60 >60  GFRAA  --   --   --  >60 >60    LIVER FUNCTION TESTS:  Recent Labs  04/26/16 0642  BILITOT 0.4  AST 18  ALT 18  ALKPHOS 50  PROT 6.4*  ALBUMIN 3.6    TUMOR MARKERS: No results for input(s): AFPTM, CEA, CA199, CHROMGRNA in the last 8760 hours.  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 All of the patient's questions were answered, patient is agreeable to proceed. Consent signed and in chart.  Pt aware if intervention is performed, she will be admitted overnight to Neuro ICU and plan for discharge next day. Agreeable to proceed Consent signed andin chart  Thank you for this interesting consult.  I greatly enjoyed meeting RILIE RANA and look forward to participating in their care.  A copy of this report was sent to the requesting provider on this date.  Electronically Signed: Monia Sabal  A 04/26/2016, 7:43 AM   I spent a total of  30 Minutes   in face to face in clinical consultation, greater than 50% of which was counseling/coordinating care for cerebral arteriogram with poss embolization of RICA/paraophthalmic artery aneurysm

## 2016-04-26 NOTE — Progress Notes (Signed)
Contacted lab regarding delay in P2Y12 results. They advised that our machine was down and was sent out to [facility]. Results will not be back until tomorrow. Spoke to [place] regarding same. Dr. Estanislado Pandy on the way to speak to patient.

## 2016-04-27 ENCOUNTER — Encounter (HOSPITAL_COMMUNITY): Payer: Self-pay | Admitting: Radiology

## 2016-04-27 ENCOUNTER — Telehealth (HOSPITAL_COMMUNITY): Payer: Self-pay | Admitting: Radiology

## 2016-04-27 NOTE — Telephone Encounter (Signed)
Called pt, told her that the P2Y12 machine was now back up in the lab. We received her results from yesterday's test and per Deveshwar she should change her Plavix dose to 75mg  1 day and 37.5mg  the next and alternate the dose in this manner. She was told to continue on her Aspirin 81mg  daily but she stated she was actually taking Aspirin 325mg  daily. She wanted to know should she reduce it back down to 81mg  daily. I told her I would ask Dr. Estanislado Pandy and get back in touch with her. I also told her to come back this Friday, 04/30/16 and recheck her P2Y12.  She agrees to do so. I let her know that I am rescheduling her procedure for next Wednesday, 05/05/16. Pt is in agreement with this plan. JM

## 2016-04-27 NOTE — Telephone Encounter (Signed)
Called pt, left VM that she can continue as is on her Aspirin 325mg  1 daily but that per Deveshwar her Plavix is to be 75mg  1 tablet one day and 37.5mg  the next day and to alternate those two. Told pt to come Friday, 9/29 for a recheck P2Y12 and that her intervention will be on Wednesday, 05/05/16. JM

## 2016-04-29 ENCOUNTER — Telehealth: Payer: Self-pay

## 2016-04-29 NOTE — Telephone Encounter (Signed)
Released via mychart

## 2016-04-29 NOTE — Telephone Encounter (Signed)
-----   Message from Pieter Partridge, DO sent at 04/29/2016  8:40 AM EDT ----- Heart monitor showed no abnormalities

## 2016-04-30 ENCOUNTER — Other Ambulatory Visit: Payer: Self-pay | Admitting: Radiology

## 2016-04-30 LAB — PLATELET INHIBITION P2Y12: PLATELET FUNCTION P2Y12: 165 [PRU] — AB (ref 194–418)

## 2016-05-03 ENCOUNTER — Telehealth (HOSPITAL_COMMUNITY): Payer: Self-pay | Admitting: *Deleted

## 2016-05-03 ENCOUNTER — Other Ambulatory Visit: Payer: Self-pay | Admitting: Radiology

## 2016-05-03 NOTE — Telephone Encounter (Signed)
Per Dr/ Estanislado Pandy patient is take Plavix 75mg  daily and ASA 325mg  daily. Pt voiced understanding.  Recheck p2Y12 on 05/05/16

## 2016-05-04 ENCOUNTER — Encounter (HOSPITAL_COMMUNITY): Payer: Self-pay | Admitting: *Deleted

## 2016-05-04 ENCOUNTER — Other Ambulatory Visit: Payer: Self-pay | Admitting: Radiology

## 2016-05-04 MED ORDER — OXYCODONE HCL 5 MG/5ML PO SOLN: 5.0000 mg | Freq: Once | ORAL | Status: DC | PRN

## 2016-05-04 MED ORDER — ONDANSETRON HCL 4 MG/2ML IJ SOLN: 4.0000 mg | Freq: Once | INTRAMUSCULAR | Status: DC | PRN

## 2016-05-04 MED ORDER — OXYCODONE HCL 5 MG PO TABS: 5.0000 mg | ORAL_TABLET | Freq: Once | ORAL | Status: DC | PRN

## 2016-05-04 MED ORDER — FENTANYL CITRATE (PF) 100 MCG/2ML IJ SOLN: 25.0000 ug | INTRAMUSCULAR | Status: DC | PRN

## 2016-05-04 NOTE — Progress Notes (Signed)
I was unable to reach patient by phone.  I left  A message on voice mail.  I instructed the patient to arrive at [facility] entrance at 6:30AM  , nothing to eat or drink after midnight.   I instructed the patient to take the following medications in the am with just enough water to get them down: Plavix, Aspirin, Metoprolol, Pantoprazole, Venlafaxine. I asked patient to not wear any lotions, powders, cologne, jewelry, piercing, make-up or nail polish and to not shave.  I asked the patient to call [phone], in the am if there were any questions or problems.

## 2016-05-05 ENCOUNTER — Ambulatory Visit (HOSPITAL_COMMUNITY): Payer: 59 | Admitting: Critical Care Medicine

## 2016-05-05 ENCOUNTER — Ambulatory Visit (HOSPITAL_COMMUNITY)
Admission: RE | Admit: 2016-05-05 | Discharge: 2016-05-05 | Disposition: A | Discharge status: Home / Self Care | Payer: 59 | Source: Ambulatory Visit | Attending: Interventional Radiology | Admitting: Interventional Radiology

## 2016-05-05 ENCOUNTER — Encounter (HOSPITAL_COMMUNITY)
Admission: AD | Disposition: A | Discharge status: Home / Self Care | Payer: Self-pay | Source: Ambulatory Visit | Attending: Interventional Radiology

## 2016-05-05 ENCOUNTER — Encounter (HOSPITAL_COMMUNITY): Payer: Self-pay | Admitting: Anesthesiology

## 2016-05-05 ENCOUNTER — Inpatient Hospital Stay (HOSPITAL_COMMUNITY)
Admission: AD | Admit: 2016-05-05 | Discharge: 2016-05-06 | DRG: 026 | Disposition: A | Discharge status: Home / Self Care | Payer: 59 | Source: Ambulatory Visit | Attending: Interventional Radiology | Admitting: Interventional Radiology

## 2016-05-05 DIAGNOSIS — Z8673 Personal history of transient ischemic attack (TIA), and cerebral infarction without residual deficits: Secondary | ICD-10-CM | POA: Diagnosis not present

## 2016-05-05 DIAGNOSIS — R4701 Aphasia: Secondary | ICD-10-CM | POA: Diagnosis present

## 2016-05-05 DIAGNOSIS — I671 Cerebral aneurysm, nonruptured: Principal | ICD-10-CM | POA: Diagnosis present

## 2016-05-05 DIAGNOSIS — Z8632 Personal history of gestational diabetes: Secondary | ICD-10-CM | POA: Diagnosis not present

## 2016-05-05 DIAGNOSIS — I10 Essential (primary) hypertension: Secondary | ICD-10-CM | POA: Diagnosis present

## 2016-05-05 DIAGNOSIS — E7800 Pure hypercholesterolemia, unspecified: Secondary | ICD-10-CM | POA: Diagnosis present

## 2016-05-05 DIAGNOSIS — Z8249 Family history of ischemic heart disease and other diseases of the circulatory system: Secondary | ICD-10-CM

## 2016-05-05 DIAGNOSIS — Z87891 Personal history of nicotine dependence: Secondary | ICD-10-CM

## 2016-05-05 DIAGNOSIS — Z87442 Personal history of urinary calculi: Secondary | ICD-10-CM | POA: Diagnosis not present

## 2016-05-05 DIAGNOSIS — K219 Gastro-esophageal reflux disease without esophagitis: Secondary | ICD-10-CM | POA: Diagnosis not present

## 2016-05-05 DIAGNOSIS — Z85828 Personal history of other malignant neoplasm of skin: Secondary | ICD-10-CM

## 2016-05-05 HISTORY — PX: ANEURYSM COILING: SHX5349

## 2016-05-05 HISTORY — PX: IR GENERIC HISTORICAL: IMG1180011

## 2016-05-05 HISTORY — PX: RADIOLOGY WITH ANESTHESIA: SHX6223

## 2016-05-05 LAB — COMPREHENSIVE METABOLIC PANEL
ALT: 20 U/L (ref 14–54)
AST: 21 U/L (ref 15–41)
Albumin: 3.8 g/dL (ref 3.5–5.0)
Alkaline Phosphatase: 55 U/L (ref 38–126)
Anion gap: 6 (ref 5–15)
BUN: 15 mg/dL (ref 6–20)
CHLORIDE: 108 mmol/L (ref 101–111)
CO2: 27 mmol/L (ref 22–32)
CREATININE: 0.79 mg/dL (ref 0.44–1.00)
Calcium: 9.5 mg/dL (ref 8.9–10.3)
GFR calc Af Amer: >60 mL/min (ref >60)
Glucose, Bld: 111 mg/dL — ABNORMAL HIGH (ref 65–99)
Potassium: 3.9 mmol/L (ref 3.5–5.1)
Sodium: 141 mmol/L (ref 135–145)
Total Bilirubin: 0.5 mg/dL (ref 0.3–1.2)
Total Protein: 6.7 g/dL (ref 6.5–8.1)

## 2016-05-05 LAB — PROTIME-INR
INR: 0.94
Prothrombin Time: 12.6 seconds (ref 11.4–15.2)

## 2016-05-05 LAB — CBC WITH DIFFERENTIAL/PLATELET
BASOS ABS: 0 10*3/uL (ref 0.0–0.1)
Basophils Relative: 1 %
EOS PCT: 3 %
Eosinophils Absolute: 0.2 10*3/uL (ref 0.0–0.7)
HCT: 38.1 % (ref 36.0–46.0)
HEMOGLOBIN: 12.3 g/dL (ref 12.0–15.0)
LYMPHS PCT: 31 %
Lymphs Abs: 2 10*3/uL (ref 0.7–4.0)
MCH: 28.7 pg (ref 26.0–34.0)
MCHC: 32.3 g/dL (ref 30.0–36.0)
MCV: 88.8 fL (ref 78.0–100.0)
Monocytes Absolute: 0.6 10*3/uL (ref 0.1–1.0)
Monocytes Relative: 9 %
NEUTROS ABS: 3.7 10*3/uL (ref 1.7–7.7)
NEUTROS PCT: 56 %
PLATELETS: 216 10*3/uL (ref 150–400)
RBC: 4.29 MIL/uL (ref 3.87–5.11)
RDW: 14.1 % (ref 11.5–15.5)
WBC: 6.5 10*3/uL (ref 4.0–10.5)

## 2016-05-05 LAB — HEPARIN LEVEL (UNFRACTIONATED): Heparin Unfractionated: 0.72 IU/mL — ABNORMAL HIGH (ref 0.30–0.70)

## 2016-05-05 LAB — POCT ACTIVATED CLOTTING TIME
ACTIVATED CLOTTING TIME: 164 s
ACTIVATED CLOTTING TIME: 197 s
Activated Clotting Time: 208 seconds

## 2016-05-05 LAB — PLATELET INHIBITION P2Y12: PLATELET FUNCTION P2Y12: 204 [PRU] (ref 194–418)

## 2016-05-05 LAB — APTT: APTT: 26 s (ref 24–36)

## 2016-05-05 LAB — HCG, SERUM, QUALITATIVE: Preg, Serum: NEGATIVE

## 2016-05-05 LAB — GLUCOSE, CAPILLARY: Glucose-Capillary: 89 mg/dL (ref 65–99)

## 2016-05-05 SURGERY — RADIOLOGY WITH ANESTHESIA
Anesthesia: General

## 2016-05-05 MED ORDER — PROPOFOL 10 MG/ML IV BOLUS
INTRAVENOUS | Status: DC | PRN
Start: 2016-05-05 — End: 2016-05-05
  Administered 2016-05-05: 140 mg via INTRAVENOUS

## 2016-05-05 MED ORDER — MEPERIDINE HCL 25 MG/ML IJ SOLN: 6.2500 mg | INTRAMUSCULAR | Status: DC | PRN

## 2016-05-05 MED ORDER — ONDANSETRON HCL 4 MG/2ML IJ SOLN: 4.0000 mg | Freq: Four times a day (QID) | INTRAMUSCULAR | Status: DC | PRN

## 2016-05-05 MED ORDER — IOPAMIDOL (ISOVUE-300) INJECTION 61%
INTRAVENOUS | Status: AC
  Administered 2016-05-05: 75 mL
  Filled 2016-05-05: qty 100

## 2016-05-05 MED ORDER — LACTATED RINGERS IV SOLN
INTRAVENOUS | Status: DC | PRN
  Administered 2016-05-05: 09:00:00 via INTRAVENOUS

## 2016-05-05 MED ORDER — LACTATED RINGERS IV SOLN
INTRAVENOUS | Status: DC | PRN
  Administered 2016-05-05 (×2): via INTRAVENOUS

## 2016-05-05 MED ORDER — ESMOLOL HCL 100 MG/10ML IV SOLN
INTRAVENOUS | Status: DC | PRN
  Administered 2016-05-05: 20 mg via INTRAVENOUS
  Administered 2016-05-05: 10 mg via INTRAVENOUS
  Administered 2016-05-05: 20 mg via INTRAVENOUS
  Administered 2016-05-05: 30 mg via INTRAVENOUS
  Administered 2016-05-05: 20 mg via INTRAVENOUS

## 2016-05-05 MED ORDER — ROCURONIUM BROMIDE 10 MG/ML (PF) SYRINGE
PREFILLED_SYRINGE | INTRAVENOUS | Status: DC | PRN
  Administered 2016-05-05: 50 mg via INTRAVENOUS

## 2016-05-05 MED ORDER — SODIUM CHLORIDE 0.9 % IJ SOLN
INTRAVENOUS | Status: DC | PRN
  Administered 2016-05-05 (×2): 25 ug via INTRA_ARTERIAL

## 2016-05-05 MED ORDER — NICARDIPINE HCL IN NACL 20-0.86 MG/200ML-% IV SOLN: 5.0000 mg/h | INTRAVENOUS | Status: DC

## 2016-05-05 MED ORDER — ACETAMINOPHEN 500 MG PO TABS
1000.0000 mg | ORAL_TABLET | Freq: Four times a day (QID) | ORAL | Status: DC | PRN
  Administered 2016-05-05: 1000 mg via ORAL
  Filled 2016-05-05: qty 2

## 2016-05-05 MED ORDER — FENTANYL CITRATE (PF) 100 MCG/2ML IJ SOLN
INTRAMUSCULAR | Status: DC | PRN
  Administered 2016-05-05 (×2): 50 ug via INTRAVENOUS

## 2016-05-05 MED ORDER — PHENYLEPHRINE HCL 10 MG/ML IJ SOLN
INTRAVENOUS | Status: DC | PRN
  Administered 2016-05-05: 10 ug/min via INTRAVENOUS

## 2016-05-05 MED ORDER — HEPARIN SODIUM (PORCINE) 1000 UNIT/ML IJ SOLN
INTRAMUSCULAR | Status: DC | PRN
  Administered 2016-05-05: 3000 [IU] via INTRAVENOUS
  Administered 2016-05-05: 500 [IU] via INTRAVENOUS
  Administered 2016-05-05: 1000 [IU] via INTRAVENOUS

## 2016-05-05 MED ORDER — HEPARIN (PORCINE) IN NACL 100-0.45 UNIT/ML-% IJ SOLN
550.0000 [IU]/h | INTRAMUSCULAR | Status: AC
  Filled 2016-05-05: qty 250

## 2016-05-05 MED ORDER — IOPAMIDOL (ISOVUE-300) INJECTION 61%
INTRAVENOUS | Status: AC
  Administered 2016-05-05: 50 mL
  Filled 2016-05-05: qty 150

## 2016-05-05 MED ORDER — HYDROMORPHONE HCL 1 MG/ML IJ SOLN: 0.2500 mg | INTRAMUSCULAR | Status: DC | PRN

## 2016-05-05 MED ORDER — MIDAZOLAM HCL 5 MG/5ML IJ SOLN
INTRAMUSCULAR | Status: DC | PRN
  Administered 2016-05-05 (×2): 1 mg via INTRAVENOUS

## 2016-05-05 MED ORDER — HEPARIN (PORCINE) IN NACL 100-0.45 UNIT/ML-% IJ SOLN
750.0000 [IU]/h | INTRAMUSCULAR | Status: DC
  Filled 2016-05-05: qty 250

## 2016-05-05 MED ORDER — HEPARIN (PORCINE) IN NACL 100-0.45 UNIT/ML-% IJ SOLN
750.0000 [IU]/h | INTRAMUSCULAR | Status: DC
  Administered 2016-05-05: 750 [IU]/h via INTRAVENOUS
  Filled 2016-05-05: qty 250

## 2016-05-05 MED ORDER — ACETAMINOPHEN 650 MG RE SUPP: 650.0000 mg | Freq: Four times a day (QID) | RECTAL | Status: DC | PRN

## 2016-05-05 MED ORDER — CEFAZOLIN SODIUM-DEXTROSE 2-3 GM-% IV SOLR
INTRAVENOUS | Status: DC | PRN
  Administered 2016-05-05: 2 g via INTRAVENOUS

## 2016-05-05 MED ORDER — LIDOCAINE 2% (20 MG/ML) 5 ML SYRINGE
INTRAMUSCULAR | Status: DC | PRN
  Administered 2016-05-05: 50 mg via INTRAVENOUS

## 2016-05-05 MED ORDER — LABETALOL HCL 5 MG/ML IV SOLN
INTRAVENOUS | Status: DC | PRN
  Administered 2016-05-05: 2.5 mg via INTRAVENOUS
  Administered 2016-05-05 (×2): 5 mg via INTRAVENOUS
  Administered 2016-05-05: 2.5 mg via INTRAVENOUS

## 2016-05-05 MED ORDER — NITROGLYCERIN 1 MG/10 ML FOR IR/CATH LAB
INTRA_ARTERIAL | Status: AC
  Filled 2016-05-05: qty 10

## 2016-05-05 MED ORDER — CLOPIDOGREL BISULFATE 75 MG PO TABS
ORAL_TABLET | ORAL | Status: AC
  Filled 2016-05-05: qty 3

## 2016-05-05 MED ORDER — LACTATED RINGERS IV SOLN: INTRAVENOUS | Status: DC

## 2016-05-05 MED ORDER — DEXAMETHASONE SODIUM PHOSPHATE 10 MG/ML IJ SOLN
INTRAMUSCULAR | Status: DC | PRN
  Administered 2016-05-05: 10 mg via INTRAVENOUS

## 2016-05-05 MED ORDER — SODIUM CHLORIDE 0.9 % IV SOLN: INTRAVENOUS | Status: DC

## 2016-05-05 MED ORDER — CEFAZOLIN SODIUM-DEXTROSE 2-4 GM/100ML-% IV SOLN
INTRAVENOUS | Status: AC
  Filled 2016-05-05: qty 100

## 2016-05-05 MED ORDER — CLOPIDOGREL BISULFATE 75 MG PO TABS
75.0000 mg | ORAL_TABLET | Freq: Every day | ORAL | Status: DC
  Administered 2016-05-06: 75 mg via ORAL
  Filled 2016-05-05: qty 1

## 2016-05-05 MED ORDER — HEPARIN (PORCINE) IN NACL 100-0.45 UNIT/ML-% IJ SOLN
550.0000 [IU]/h | INTRAMUSCULAR | Status: DC
  Filled 2016-05-05: qty 250

## 2016-05-05 MED ORDER — ASPIRIN 325 MG PO TABS
325.0000 mg | ORAL_TABLET | Freq: Every day | ORAL | Status: DC
  Administered 2016-05-06: 325 mg via ORAL
  Filled 2016-05-05: qty 1

## 2016-05-05 MED ORDER — PROMETHAZINE HCL 25 MG/ML IJ SOLN: 6.2500 mg | INTRAMUSCULAR | Status: DC | PRN

## 2016-05-05 MED ORDER — LIDOCAINE HCL (CARDIAC) 20 MG/ML IV SOLN: INTRAVENOUS | Status: DC | PRN

## 2016-05-05 MED ORDER — ONDANSETRON HCL 4 MG/2ML IJ SOLN
INTRAMUSCULAR | Status: DC | PRN
  Administered 2016-05-05: 4 mg via INTRAVENOUS

## 2016-05-05 MED ORDER — SUGAMMADEX SODIUM 200 MG/2ML IV SOLN
INTRAVENOUS | Status: DC | PRN
  Administered 2016-05-05: 160 mg via INTRAVENOUS

## 2016-05-05 MED ORDER — HEPARIN (PORCINE) IN NACL 100-0.45 UNIT/ML-% IJ SOLN
INTRAMUSCULAR | Status: AC
  Filled 2016-05-05: qty 250

## 2016-05-05 NOTE — Sedation Documentation (Signed)
5 fr. Exoseal to right groin 

## 2016-05-05 NOTE — Anesthesia Procedure Notes (Addendum)
Procedure Name: Intubation Date/Time: 05/05/2016 9:09 AM Performed by: Merrilyn Puma B Pre-anesthesia Checklist: Patient identified, Emergency Drugs available, Suction available, Patient being monitored and Timeout performed Patient Re-evaluated:Patient Re-evaluated prior to inductionOxygen Delivery Method: Circle system utilized Preoxygenation: Pre-oxygenation with 100% oxygen Intubation Type: IV induction Ventilation: Mask ventilation without difficulty and Oral airway inserted - appropriate to patient size Grade View: Grade II Tube type: Oral Tube size: 7.0 mm Number of attempts: 3 Airway Equipment and Method: Stylet and Video-laryngoscopy Placement Confirmation: ETT inserted through vocal cords under direct vision,  positive ETCO2,  CO2 detector and breath sounds checked- equal and bilateral Secured at: 21 cm Tube secured with: Tape Dental Injury: Teeth and Oropharynx as per pre-operative assessment  Difficulty Due To: Difficulty was anticipated and Difficult Airway- due to limited oral opening Future Recommendations: Recommend- induction with short-acting agent, and alternative techniques readily available Comments: DLx1 with MAC 3 - grade 4 view - esophageal intubation.  DLx1 with McGrath - poor visualization on screen.  DLx1 with glidescope - grade 2 view. Recommend intubation with glidescope in future.  Easy mask ventilation with oral airway.

## 2016-05-05 NOTE — Transfer of Care (Signed)
Immediate Anesthesia Transfer of Care Note  Patient: Deborah Thompson  Procedure(s) Performed: Procedure(s): EMBOLIZATION (N/A)  Patient Location: PACU  Anesthesia Type:General  Level of Consciousness: awake, alert  and oriented  Airway & Oxygen Therapy: Patient Spontanous Breathing and Patient connected to nasal cannula oxygen  Post-op Assessment: Report given to RN, Post -op Vital signs reviewed and stable, Patient moving all extremities X 4 and Patient able to stick tongue midline  Post vital signs: Reviewed and stable  Last Vitals: There were no vitals filed for this visit.  Last Pain: There were no vitals filed for this visit.       Complications: No apparent anesthesia complications

## 2016-05-05 NOTE — Procedures (Addendum)
S/P RT common carotid arterioogram. RT CFA approach. With endovascular embolization of irregular RT ICA intracranial periophthalmic aneurysm with pipeline flow diverter devise. RT ICA wide necked 5.47mmx 4.6 mm aneurysm at C2 ?pseudoaneurysm

## 2016-05-05 NOTE — Progress Notes (Signed)
ANTICOAGULATION CONSULT NOTE - Follow Up Consult  Pharmacy Consult for Heparin  Indication: post-IR procedure  No Known Allergies  Patient Measurements: Height: 5\' 6"  (167.6 cm) (from preadmit/IR 05/05/16) Weight: 180 lb (81.6 kg) (from preadmit/IR 05/05/16) IBW/kg (Calculated) : 59.3 Heparin Dosing Weight: 76.5 kg  Vital Signs: Temp: 98.1 F (36.7 C) (10/04 2000) Temp Source: Oral (10/04 2000) BP: 126/70 (10/04 1900) Pulse Rate: 96 (10/04 1900)  Labs:  Recent Labs  05/05/16 0714 05/05/16 1900  HGB 12.3  --   HCT 38.1  --   PLT 216  --   APTT 26  --   LABPROT 12.6  --   INR 0.94  --   HEPARINUNFRC  --  0.72*  CREATININE 0.79  --     Estimated Creatinine Clearance: 88.6 mL/min (by C-G formula based on SCr of 0.79 mg/dL).   Medications:  Prescriptions Prior to Admission  Medication Sig Dispense Refill Last Dose  . aspirin (EC-81 ASPIRIN) 81 MG EC tablet taking 4 tablets daily prior to surgery   05/05/2016 at Unknown time  . atorvastatin (LIPITOR) 40 MG tablet Take 40 mg by mouth every evening.    05/04/2016 at Unknown time  . Calcium Carb-Cholecalciferol (CALCIUM + D3 PO) Take 600 mg by mouth 2 (two) times daily. 600 mg    Past Week at Unknown time  . clopidogrel (PLAVIX) 75 MG tablet Take 37.5-75 mg by mouth See admin instructions. Alternates between 75 mg I day and 37.5 mg the next day   05/05/2016 at Unknown time  . Glucosamine HCl 1000 MG TABS Take 2 tablets by mouth daily.   Past Week at Unknown time  . levonorgestrel (MIRENA, 52 MG,) 20 MCG/24HR IUD 1 each by Intrauterine route once.   Past Week at Unknown time  . lisinopril (PRINIVIL,ZESTRIL) 40 MG tablet Take 40 mg by mouth daily.   1 05/04/2016 at Unknown time  . metoprolol succinate (TOPROL-XL) 100 MG 24 hr tablet Take 50 mg by mouth every evening.    05/04/2016 at Unknown time  . Multiple Vitamin (MULTIVITAMIN) tablet Take 1 tablet by mouth daily.   Past Week at Unknown time  . Omega-3 Fatty Acids (FISH OIL) 1000  MG CPDR Take 1 capsule by mouth 2 (two) times daily.    Past Week at Unknown time  . pantoprazole (PROTONIX) 40 MG tablet Take 40 mg by mouth daily.   05/04/2016 at Unknown time  . spironolactone (ALDACTONE) 25 MG tablet Take 0.5 tablets (12.5 mg total) by mouth daily. 45 tablet 3 05/05/2016 at Unknown time  . venlafaxine XR (EFFEXOR-XR) 75 MG 24 hr capsule Take 75 mg by mouth daily with breakfast.   05/05/2016 at Unknown time   Scheduled:  . [START ON 05/06/2016] aspirin  325 mg Oral Q breakfast  . 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 10/4 s/p carotid arteriogram with endovascular embolization of irregular RT ICA intracranial periophthalmic aneurysm with pipeline flow diverter devise. Started on IV heparin infusion at 750 units/hr post-op. The initial 6 hour heparin level is 0.72 units/ml, which is above goal. No bleeding reported.    Goal of Therapy:  Heparin level 0.1-0.25 units/ml Monitor platelets by anticoagulation protocol: Yes   Plan:  Hold heparin x1 hour then resume heparin drip at lower rate of 550 units/h  Recheck heparin level 6hrs later. Heparin drip to stop at 0700 in  AM.   Nicole Cella, RPh Clinical Pharmacist Pager: [phone] 05/05/2016,9:10 PM

## 2016-05-05 NOTE — Anesthesia Postprocedure Evaluation (Signed)
Anesthesia Post Note  Patient: Deborah Thompson  Procedure(s) Performed: Procedure(s) (LRB): EMBOLIZATION (N/A)  Patient location during evaluation: PACU Anesthesia Type: General Level of consciousness: awake and alert Pain management: pain level controlled Vital Signs Assessment: post-procedure vital signs reviewed and stable Respiratory status: spontaneous breathing, nonlabored ventilation, respiratory function stable and patient connected to nasal cannula oxygen Cardiovascular status: blood pressure returned to baseline and stable Postop Assessment: no signs of nausea or vomiting Anesthetic complications: no    Last Vitals: There were no vitals filed for this visit.  Last Pain: There were no vitals filed for this visit.               Effie Berkshire

## 2016-05-05 NOTE — Anesthesia Preprocedure Evaluation (Addendum)
Anesthesia Evaluation  Patient identified by MRN, date of birth, ID band Patient awake    Reviewed: Allergy & Precautions, NPO status , Patient's Chart, lab work & pertinent test results  Airway Mallampati: IV  TM Distance: >3 FB Neck ROM: Full    Dental  (+) Teeth Intact, Dental Advisory Given   Pulmonary neg pulmonary ROS, former smoker,    breath sounds clear to auscultation       Cardiovascular hypertension, Pt. on medications and Pt. on home beta blockers  Rhythm:Regular Rate:Normal     Neuro/Psych  Headaches, PSYCHIATRIC DISORDERS Depression CVA    GI/Hepatic Neg liver ROS, GERD  Medicated,  Endo/Other  negative endocrine ROS  Renal/GU negative Renal ROS  negative genitourinary   Musculoskeletal negative musculoskeletal ROS (+)   Abdominal   Peds negative pediatric ROS (+)  Hematology negative hematology ROS (+)   Anesthesia Other Findings   Reproductive/Obstetrics negative OB ROS                            Lab Results  Component Value Date   WBC 6.5 05/05/2016   HGB 12.3 05/05/2016   HCT 38.1 05/05/2016   MCV 88.8 05/05/2016   PLT 216 05/05/2016   Lab Results  Component Value Date   CREATININE 0.73 04/26/2016   BUN 12 04/26/2016   NA 139 04/26/2016   K 3.8 04/26/2016   CL 105 04/26/2016   CO2 24 04/26/2016   Lab Results  Component Value Date   INR 1.01 04/26/2016   INR 1.02 04/02/2016     11/2015 EKG: normal sinus rhythm.  01/2016 Echo - Left ventricle: The cavity size was normal. Wall thickness was   normal. Systolic function was normal. The estimated ejection   fraction was in the range of 60% to 65%. Wall motion was normal;   there were no regional wall motion abnormalities. Doppler   parameters are consistent with abnormal left ventricular   relaxation (grade 1 diastolic dysfunction).  Anesthesia Physical Anesthesia Plan  ASA: III  Anesthesia Plan:  General   Post-op Pain Management:    Induction: Intravenous  Airway Management Planned: Oral ETT  Additional Equipment: Arterial line  Intra-op Plan:   Post-operative Plan: Extubation in OR  Informed Consent: I have reviewed the patients History and Physical, chart, labs and discussed the procedure including the risks, benefits and alternatives for the proposed anesthesia with the patient or authorized representative who has indicated his/her understanding and acceptance.   Dental advisory given  Plan Discussed with: CRNA  Anesthesia Plan Comments:         Anesthesia Quick Evaluation

## 2016-05-05 NOTE — H&P (Signed)
Chief Complaint: right para opthalmic aneurysm   Referring Physician: Dr. Metta Clines  Supervising Physician: Luanne Bras  Patient Status: Out-pt  HPI: Deborah Thompson is an 50 y.o. female who had a CVA in June of 2017.  After further work up she was found to have a right para opthalmic aneurysm.  She underwent a DX angio by Dr. Estanislado Pandy on 04-02-16.  Treatment for this was recommended which the patient agreed with.  She has been taking her Plavix and ASA as instructed.  She presents today for this treatment.  She is feeling well with no new complaints.  She actually thinks her aphasia has improved to resolution over the last week.  Past Medical History:  Past Medical History:  Diagnosis Date  . Cardiomyopathy [facility])    after son was born  . Depression   . GERD (gastroesophageal reflux disease)   . History of gestational diabetes   . History of kidney stones   . Hypercholesterolemia   . Hypertension   . Migraines   . Preeclampsia   . Skin cancer   . Stroke [facility])    reports that she had difficulty in making words    Past Surgical History:  Past Surgical History:  Procedure Laterality Date  . IR GENERIC HISTORICAL  03/30/2016   IR RADIOLOGIST EVAL & MGMT 03/30/2016 [facility]INTER[facility]  . IR GENERIC HISTORICAL  04/02/2016   IR ANGIO INTRA EXTRACRAN SEL COM CAROTID INNOMINATE BILAT MOD SED 04/02/2016 Luanne Bras, MD [facility]INTER[facility]  . IR GENERIC HISTORICAL  04/02/2016   IR ANGIO VERTEBRAL SEL VERTEBRAL BILAT MOD SED 04/02/2016 Luanne Bras, MD [facility]  . IR GENERIC HISTORICAL  04/13/2016   IR RADIOLOGIST EVAL & MGMT 04/13/2016 [facility]  . NEPHROLITHOTOMY  04-1999   during pregnancy, left kidney  . RADIOLOGY WITH ANESTHESIA N/A 04/26/2016   Procedure: EMBOLIZATION;  Surgeon: Medication Radiologist, MD;  Location: [place];  Service: Radiology;  Laterality: N/A;  . skin cancer removal    . WISDOM TOOTH EXTRACTION  1984    Family History:  Family History  Problem  Relation Age of Onset  . Heart disease Mother   . Heart disease Father   . Colon cancer Neg Hx     Social History:  reports that she has quit smoking. Her smoking use included Cigarettes. She has a 4.00 pack-year smoking history. She has never used smokeless tobacco. She reports that she drinks alcohol. She reports that she does not use drugs.  Allergies: No Known Allergies  Medications: Medications reviewed in Epic  Please HPI for pertinent positives, otherwise complete 10 system ROS negative.  Mallampati Score: MD Evaluation Airway: WNL Heart: WNL Abdomen: WNL Chest/ Lungs: WNL ASA  Classification: 2 Mallampati/Airway Score: Two  Physical Exam:   Temp (F)   98.3  98.3 (36.8)  10/04 0745  Pulse Rate   54  54  10/04 0745  Resp   20  20  10/04 0745  BP   129/66  129/66  10/04 0745  SpO2 (%)   100  100  10/04 0745  Weight (lb)   180  180 lb (81.6 kg)  10/   General: pleasant, WD, WN white female who is laying in bed in NAD HEENT: head is normocephalic, atraumatic.  Sclera are noninjected.  PERRL.  Ears and nose without any masses or lesions.  Mouth is pink and moist Heart: regular, rate, and rhythm.  Normal s1,s2. No obvious murmurs, gallops, or rubs noted.  Palpable radial and pedal pulses bilaterally Lungs: CTAB, no wheezes, rhonchi, or rales noted.  Respiratory effort nonlabored Abd: soft, NT, ND, +BS, no masses, hernias, or organomegaly MS: all 4 extremities are symmetrical with no cyanosis, clubbing, or edema. Neuro: NV intact with no deficits.  Finger to nose test normal. Psych: A&Ox3 with an appropriate affect.   Labs: Results for orders placed or performed during the hospital encounter of 05/05/16 (from the past 48 hour(s))  hCG, serum, qualitative     Status: None   Collection Time: 05/05/16  7:14 AM  Result Value Ref Range   Preg, Serum NEGATIVE NEGATIVE    Comment:        THE SENSITIVITY OF THIS METHODOLOGY IS >10 mIU/mL.   APTT     Status: None    Collection Time: 05/05/16  7:14 AM  Result Value Ref Range   aPTT 26 24 - 36 seconds  CBC WITH DIFFERENTIAL     Status: None   Collection Time: 05/05/16  7:14 AM  Result Value Ref Range   WBC 6.5 4.0 - 10.5 K/uL   RBC 4.29 3.87 - 5.11 MIL/uL   Hemoglobin 12.3 12.0 - 15.0 g/dL   HCT 64.2 73.5 - 22.6 %   MCV 88.8 78.0 - 100.0 fL   MCH 28.7 26.0 - 34.0 pg   MCHC 32.3 30.0 - 36.0 g/dL   RDW 23.4 96.4 - 57.8 %   Platelets 216 150 - 400 K/uL   Neutrophils Relative % 56 %   Neutro Abs 3.7 1.7 - 7.7 K/uL   Lymphocytes Relative 31 %   Lymphs Abs 2.0 0.7 - 4.0 K/uL   Monocytes Relative 9 %   Monocytes Absolute 0.6 0.1 - 1.0 K/uL   Eosinophils Relative 3 %   Eosinophils Absolute 0.2 0.0 - 0.7 K/uL   Basophils Relative 1 %   Basophils Absolute 0.0 0.0 - 0.1 K/uL  Comprehensive metabolic panel     Status: Abnormal   Collection Time: 05/05/16  7:14 AM  Result Value Ref Range   Sodium 141 135 - 145 mmol/L   Potassium 3.9 3.5 - 5.1 mmol/L   Chloride 108 101 - 111 mmol/L   CO2 27 22 - 32 mmol/L   Glucose, Bld 111 (H) 65 - 99 mg/dL   BUN 15 6 - 20 mg/dL   Creatinine, Ser 7.77 0.44 - 1.00 mg/dL   Calcium 9.5 8.9 - 04.2 mg/dL   Total Protein 6.7 6.5 - 8.1 g/dL   Albumin 3.8 3.5 - 5.0 g/dL   AST 21 15 - 41 U/L   ALT 20 14 - 54 U/L   Alkaline Phosphatase 55 38 - 126 U/L   Total Bilirubin 0.5 0.3 - 1.2 mg/dL   GFR calc non Af Amer >60 >60 mL/min   GFR calc Af Amer >60 >60 mL/min    Comment: (NOTE) The eGFR has been calculated using the CKD EPI equation. This calculation has not been validated in all clinical situations. eGFR's persistently <60 mL/min signify possible Chronic Kidney Disease.    Anion gap 6 5 - 15  Platelet inhibition p2y12 (not at [facility])     Status: None   Collection Time: 05/05/16  7:14 AM  Result Value Ref Range   Platelet Function  P2Y12 204 194 - 418 PRU    Comment:        The literature has shown a direct correlation of PRU values over 230 with higher risks  of thrombotic events.  Lower  PRU values are associated with platelet inhibition.   Protime-INR     Status: None   Collection Time: 05/05/16  7:14 AM  Result Value Ref Range   Prothrombin Time 12.6 11.4 - 15.2 seconds   INR 0.94   Glucose, capillary     Status: None   Collection Time: 05/05/16  7:53 AM  Result Value Ref Range   Glucose-Capillary 89 65 - 99 mg/dL    Imaging: No results found.  Assessment/Plan 1. Right para opthalmic aneurysm -we will proceed today with treatment of coiling vs stent assisted coiling vs flow diversion with coil assistance.   -P2Y12 is 204.  Dr. Estanislado Pandy has advised to give 3 plavix and proceed with the procedure under GETA. -labs and vitals have been reviewed -Risks and Benefits discussed with the patient including, but not limited to bleeding, infection, vascular injury or contrast induced renal failure, CVA, or death. All of the patient's questions were answered, patient is agreeable to proceed. Consent signed and in chart.   Thank you for this interesting consult.  I greatly enjoyed meeting Deborah Thompson and look forward to participating in their care.  A copy of this report was sent to the requesting provider on this date.  Electronically Signed: Henreitta Cea 05/05/2016, 8:39 AM   I spent a total of    25 Minutes in face to face in clinical consultation, greater than 50% of which was counseling/coordinating care for right para opthalmic aneurysm

## 2016-05-05 NOTE — Progress Notes (Signed)
ANTICOAGULATION CONSULT NOTE - Initial Consult  Pharmacy Consult for Heparin Indication: post-IR procedure  No Known Allergies  Patient Measurements: IBW 61.1 Heparin Dosing Weight:  77.68 kg  Vital Signs: Temp: 98.3 F (36.8 C) (10/04 0745) Temp Source: Oral (10/04 0745) BP: 129/66 (10/04 0745) Pulse Rate: 54 (10/04 0745)  Labs:  Recent Labs  05/05/16 0714  HGB 12.3  HCT 38.1  PLT 216  APTT 26  LABPROT 12.6  INR 0.94  CREATININE 0.79    Estimated Creatinine Clearance: 88.6 mL/min (by C-G formula based on SCr of 0.79 mg/dL).   Medical History: Past Medical History:  Diagnosis Date  . Cardiomyopathy [facility])    after son was born  . Depression   . GERD (gastroesophageal reflux disease)   . History of gestational diabetes   . History of kidney stones   . Hypercholesterolemia   . Hypertension   . Migraines   . Preeclampsia   . Skin cancer   . Stroke [facility])    reports that she had difficulty in making words    Medications:  Prescriptions Prior to Admission  Medication Sig Dispense Refill Last Dose  . aspirin (EC-81 ASPIRIN) 81 MG EC tablet taking 4 tablets daily prior to surgery   05/05/2016 at Unknown time  . atorvastatin (LIPITOR) 40 MG tablet Take 40 mg by mouth every evening.    05/04/2016 at Unknown time  . Calcium Carb-Cholecalciferol (CALCIUM + D3 PO) Take 600 mg by mouth 2 (two) times daily. 600 mg    Past Week at Unknown time  . clopidogrel (PLAVIX) 75 MG tablet Take 37.5-75 mg by mouth See admin instructions. Alternates between 75 mg I day and 37.5 mg the next day   05/05/2016 at Unknown time  . Glucosamine HCl 1000 MG TABS Take 2 tablets by mouth daily.   Past Week at Unknown time  . levonorgestrel (MIRENA, 52 MG,) 20 MCG/24HR IUD 1 each by Intrauterine route once.   Past Week at Unknown time  . lisinopril (PRINIVIL,ZESTRIL) 40 MG tablet Take 40 mg by mouth daily.   1 05/04/2016 at Unknown time  . metoprolol succinate (TOPROL-XL) 100 MG 24 hr tablet Take  50 mg by mouth every evening.    05/04/2016 at Unknown time  . Multiple Vitamin (MULTIVITAMIN) tablet Take 1 tablet by mouth daily.   Past Week at Unknown time  . Omega-3 Fatty Acids (FISH OIL) 1000 MG CPDR Take 1 capsule by mouth 2 (two) times daily.    Past Week at Unknown time  . pantoprazole (PROTONIX) 40 MG tablet Take 40 mg by mouth daily.   05/04/2016 at Unknown time  . spironolactone (ALDACTONE) 25 MG tablet Take 0.5 tablets (12.5 mg total) by mouth daily. 45 tablet 3 05/05/2016 at Unknown time  . venlafaxine XR (EFFEXOR-XR) 75 MG 24 hr capsule Take 75 mg by mouth daily with breakfast.   05/05/2016 at Unknown time    Assessment: [name] s/p CVA 6/17 found to have right para opthalmic aneurysm. 10/4 s/p carotid arteriogram with endovascular embolization of irregular RT ICA intracranial periophthalmic aneurysm with pipeline flow diverter devise. Start heparin post-op.  H/H 12.3/38.1, Plts 216, Scr 0.79, CrCl 88  Goal of Therapy:  Heparin level 0.1-0.25 Monitor platelets by anticoagulation protocol: Yes   Plan:  Start IV heparin (in PACU) at 750 units/hr Will check heparin level in 6 hrs and in AM.   Deborah Thompson, PharmD, BCPS Clinical Staff Pharmacist Pager [phone]  Deborah Thompson 05/05/2016,12:34  PM

## 2016-05-05 NOTE — Progress Notes (Signed)
Patient ID: Deborah Thompson, female   DOB: 1966/01/16, 50 y.o.   MRN: [id number] Patient still in PACU awaiting a bed s/p pipeline stent treatment for right para opthalmic aneurysm.  Minimal HA, no nausea or vomiting.  Minimal numbness of her inferior and superior lip just on the right side.  Otherwise feels fairly well, considering.  PE: Gen: NAD Neuro NV intact with no deficits.  A/P 1. S/p pipeline treatment for right para-opthalmic aneurysm Will advance to full liquids -keep A-line for now -able to sit up some -await Neuro ICU bed -cont heparin drip and cardene -plavix and ASA in am -hopefully DC home in AM  Siaosi Alter E 3:52 PM 05/05/2016

## 2016-05-06 ENCOUNTER — Encounter (HOSPITAL_COMMUNITY): Payer: Self-pay | Admitting: Interventional Radiology

## 2016-05-06 LAB — CBC WITH DIFFERENTIAL/PLATELET
Basophils Absolute: 0 10*3/uL (ref 0.0–0.1)
Basophils Relative: 0 %
EOS ABS: 0 10*3/uL (ref 0.0–0.7)
EOS PCT: 0 %
HCT: 32.5 % — ABNORMAL LOW (ref 36.0–46.0)
Hemoglobin: 10.3 g/dL — ABNORMAL LOW (ref 12.0–15.0)
LYMPHS ABS: 1 10*3/uL (ref 0.7–4.0)
Lymphocytes Relative: 10 %
MCH: 28.1 pg (ref 26.0–34.0)
MCHC: 31.7 g/dL (ref 30.0–36.0)
MCV: 88.8 fL (ref 78.0–100.0)
MONO ABS: 0.7 10*3/uL (ref 0.1–1.0)
Monocytes Relative: 7 %
Neutro Abs: 8 10*3/uL — ABNORMAL HIGH (ref 1.7–7.7)
Neutrophils Relative %: 83 %
PLATELETS: 184 10*3/uL (ref 150–400)
RBC: 3.66 MIL/uL — AB (ref 3.87–5.11)
RDW: 14.2 % (ref 11.5–15.5)
WBC: 9.7 10*3/uL (ref 4.0–10.5)

## 2016-05-06 LAB — BASIC METABOLIC PANEL
Anion gap: 12 (ref 5–15)
BUN: 9 mg/dL (ref 6–20)
CO2: 23 mmol/L (ref 22–32)
CREATININE: 0.64 mg/dL (ref 0.44–1.00)
Calcium: 8.7 mg/dL — ABNORMAL LOW (ref 8.9–10.3)
Chloride: 106 mmol/L (ref 101–111)
GFR calc Af Amer: >60 mL/min (ref >60)
GLUCOSE: 95 mg/dL (ref 65–99)
Potassium: 3.7 mmol/L (ref 3.5–5.1)
SODIUM: 141 mmol/L (ref 135–145)

## 2016-05-06 LAB — MRSA PCR SCREENING: MRSA BY PCR: NEGATIVE

## 2016-05-06 LAB — PLATELET INHIBITION P2Y12: Platelet Function  P2Y12: 124 [PRU] — ABNORMAL LOW (ref 194–418)

## 2016-05-06 MED ORDER — GLUCOSAMINE HCL 1000 MG PO TABS: 2.0000 | ORAL_TABLET | Freq: Every day | ORAL | Status: DC

## 2016-05-06 MED ORDER — PANTOPRAZOLE SODIUM 40 MG PO TBEC
40.0000 mg | DELAYED_RELEASE_TABLET | Freq: Every day | ORAL | Status: DC
  Administered 2016-05-06: 40 mg via ORAL
  Filled 2016-05-06: qty 1

## 2016-05-06 MED ORDER — ATORVASTATIN CALCIUM 40 MG PO TABS: 40.0000 mg | ORAL_TABLET | Freq: Every evening | ORAL | Status: DC

## 2016-05-06 MED ORDER — OMEGA-3-ACID ETHYL ESTERS 1 G PO CAPS
1.0000 g | ORAL_CAPSULE | Freq: Two times a day (BID) | ORAL | Status: DC
  Filled 2016-05-06: qty 1

## 2016-05-06 MED ORDER — ADULT MULTIVITAMIN W/MINERALS CH
1.0000 | ORAL_TABLET | Freq: Every day | ORAL | Status: DC
  Administered 2016-05-06: 1 via ORAL
  Filled 2016-05-06: qty 1

## 2016-05-06 MED ORDER — METOPROLOL SUCCINATE ER 50 MG PO TB24: 50.0000 mg | ORAL_TABLET | Freq: Every evening | ORAL | Status: DC

## 2016-05-06 MED ORDER — CALCIUM + D3 600-200 MG-UNIT PO TABS: 600.0000 mg | ORAL_TABLET | Freq: Two times a day (BID) | ORAL | Status: DC

## 2016-05-06 MED ORDER — SPIRONOLACTONE 12.5 MG HALF TABLET
12.5000 mg | ORAL_TABLET | Freq: Every day | ORAL | Status: DC
  Filled 2016-05-06: qty 1

## 2016-05-06 MED ORDER — VENLAFAXINE HCL ER 75 MG PO CP24
75.0000 mg | ORAL_CAPSULE | Freq: Every day | ORAL | Status: DC
  Administered 2016-05-06: 75 mg via ORAL
  Filled 2016-05-06: qty 1

## 2016-05-06 MED ORDER — CALCIUM CARBONATE-VITAMIN D 500-200 MG-UNIT PO TABS
1.0000 | ORAL_TABLET | Freq: Two times a day (BID) | ORAL | Status: DC
  Administered 2016-05-06: 1 via ORAL
  Filled 2016-05-06 (×2): qty 1

## 2016-05-06 MED ORDER — LISINOPRIL 20 MG PO TABS
40.0000 mg | ORAL_TABLET | Freq: Every day | ORAL | Status: DC
  Administered 2016-05-06: 40 mg via ORAL
  Filled 2016-05-06: qty 2

## 2016-05-06 NOTE — Progress Notes (Signed)
Pt for discharge soon. Education given re: f/u with Dr. Estanislado Pandy, when to seek emergency help, and what medications to take. Pt reports understanding of d/c instructions. Waiting still on regular diet tray (ordered at 08:15). Pt has ambulated around hall, and pt has voided.

## 2016-05-06 NOTE — Progress Notes (Signed)
PHARMACIST - PHYSICIAN ORDER COMMUNICATION  CONCERNING: P&T Medication Policy on Herbal Medications  DESCRIPTION:  This patient's order for:  Glucosamine  has been noted.  This product(s) is classified as an "herbal" or natural product. Due to a lack of definitive safety studies or FDA approval, nonstandard manufacturing practices, plus the potential risk of unknown drug-drug interactions while on inpatient medications, the Pharmacy and Therapeutics Committee does not permit the use of "herbal" or natural products of this type within [facility].   ACTION TAKEN: The pharmacy department is unable to verify this order at this time and your patient has been informed of this safety policy. Please reevaluate patient's clinical condition at discharge and address if the herbal or natural product(s) should be resumed at that time.  Sherlon Handing, PharmD, BCPS Clinical pharmacist, pager [phone] 05/06/2016 6:25 AM

## 2016-05-06 NOTE — Discharge Summary (Signed)
Patient ID: Deborah Thompson MRN: [id number] DOB/AGE: November 20, 1965 50 y.o.  Admit date: 05/05/2016 Discharge date: 05/06/2016  Supervising Physician: Luanne Bras  Admission Diagnoses: Right Internal Carotid Artery aneurysm                                         Periophthalmic aneurysm  Discharge Diagnoses:  Active Problems:   Brain aneurysm   Discharged Condition: improved  Hospital Course: suffered CVA 01/2016. Work up revealed R ICA/periophthalmic artery aneurysm. Was referred to Dr Estanislado Pandy for evaluation and treatment. R ICA/periophthalmic aneurysm embolization with pipeline flow diverter stent was placed 10/4 in IR with Dr Estanislado Pandy. Pt tolerated procedure well. Overnight stay in Neuro ICU was without event. Denies pain; headache Denies N/V Denies visual or speech issues Eating well; slept well. UOP great---foley now out---need to urinate on own. Dr Estanislado Pandy has seen and evaluated pt. Plan for discharge to home   Consults: None  Significant Diagnostic Studies: Cerebral arteriogram  Treatments:  CEREBRAL ANGIOGRAM [id number] (Custom)]       S/P RT common carotid arterioogram. RT CFA approach. With endovascular embolization of irregular RT ICA intracranial periophthalmic aneurysm with pipeline flow diverter devise.        Discharge Exam: Blood pressure 120/73, pulse 76, temperature 98.8 F (37.1 C), temperature source Oral, resp. rate 16, height 5\' 6"  (1.676 m), weight 180 lb (81.6 kg), SpO2 97 %.  PE: A/O Pleasant Rested EOM Face symmetrical Tongue midline Puffs cheeks= Smile =  Heart: RRR Lungs: CTA Abd: soft; +BS NT; No masses Extr: FROM; moves all 4s Good sensation= Good strength= Rt groin NT no bleeding No hematoma Rt foot 2+ pulses  UOP: great: yellow  Results for orders placed or performed during the hospital encounter of 05/05/16  MRSA PCR Screening  Result Value Ref Range   MRSA by PCR NEGATIVE NEGATIVE  hCG, serum,  qualitative  Result Value Ref Range   Preg, Serum NEGATIVE NEGATIVE  APTT  Result Value Ref Range   aPTT 26 24 - 36 seconds  CBC WITH DIFFERENTIAL  Result Value Ref Range   WBC 6.5 4.0 - 10.5 K/uL   RBC 4.29 3.87 - 5.11 MIL/uL   Hemoglobin 12.3 12.0 - 15.0 g/dL   HCT 38.1 36.0 - 46.0 %   MCV 88.8 78.0 - 100.0 fL   MCH 28.7 26.0 - 34.0 pg   MCHC 32.3 30.0 - 36.0 g/dL   RDW 14.1 11.5 - 15.5 %   Platelets 216 150 - 400 K/uL   Neutrophils Relative % 56 %   Neutro Abs 3.7 1.7 - 7.7 K/uL   Lymphocytes Relative 31 %   Lymphs Abs 2.0 0.7 - 4.0 K/uL   Monocytes Relative 9 %   Monocytes Absolute 0.6 0.1 - 1.0 K/uL   Eosinophils Relative 3 %   Eosinophils Absolute 0.2 0.0 - 0.7 K/uL   Basophils Relative 1 %   Basophils Absolute 0.0 0.0 - 0.1 K/uL  Comprehensive metabolic panel  Result Value Ref Range   Sodium 141 135 - 145 mmol/L   Potassium 3.9 3.5 - 5.1 mmol/L   Chloride 108 101 - 111 mmol/L   CO2 27 22 - 32 mmol/L   Glucose, Bld 111 (H) 65 - 99 mg/dL   BUN 15 6 - 20 mg/dL   Creatinine, Ser 0.79 0.44 - 1.00 mg/dL   Calcium 9.5 8.9 - 10.3 mg/dL  Total Protein 6.7 6.5 - 8.1 g/dL   Albumin 3.8 3.5 - 5.0 g/dL   AST 21 15 - 41 U/L   ALT 20 14 - 54 U/L   Alkaline Phosphatase 55 38 - 126 U/L   Total Bilirubin 0.5 0.3 - 1.2 mg/dL   GFR calc non Af Amer >60 >60 mL/min   GFR calc Af Amer >60 >60 mL/min   Anion gap 6 5 - 15  Platelet inhibition p2y12 (not at [facility])  Result Value Ref Range   Platelet Function  P2Y12 204 194 - 418 PRU  Protime-INR  Result Value Ref Range   Prothrombin Time 12.6 11.4 - 15.2 seconds   INR 0.94   Glucose, capillary  Result Value Ref Range   Glucose-Capillary 89 65 - 99 mg/dL  Heparin level (unfractionated)  Result Value Ref Range   Heparin Unfractionated 0.72 (H) 0.30 - 0.70 IU/mL  Basic metabolic panel  Result Value Ref Range   Sodium 141 135 - 145 mmol/L   Potassium 3.7 3.5 - 5.1 mmol/L   Chloride 106 101 - 111 mmol/L   CO2 23 22 - 32 mmol/L     Glucose, Bld 95 65 - 99 mg/dL   BUN 9 6 - 20 mg/dL   Creatinine, Ser 0.64 0.44 - 1.00 mg/dL   Calcium 8.7 (L) 8.9 - 10.3 mg/dL   GFR calc non Af Amer >60 >60 mL/min   GFR calc Af Amer >60 >60 mL/min   Anion gap 12 5 - 15  CBC WITH DIFFERENTIAL  Result Value Ref Range   WBC 9.7 4.0 - 10.5 K/uL   RBC 3.66 (L) 3.87 - 5.11 MIL/uL   Hemoglobin 10.3 (L) 12.0 - 15.0 g/dL   HCT 32.5 (L) 36.0 - 46.0 %   MCV 88.8 78.0 - 100.0 fL   MCH 28.1 26.0 - 34.0 pg   MCHC 31.7 30.0 - 36.0 g/dL   RDW 14.2 11.5 - 15.5 %   Platelets 184 150 - 400 K/uL   Neutrophils Relative % 83 %   Neutro Abs 8.0 (H) 1.7 - 7.7 K/uL   Lymphocytes Relative 10 %   Lymphs Abs 1.0 0.7 - 4.0 K/uL   Monocytes Relative 7 %   Monocytes Absolute 0.7 0.1 - 1.0 K/uL   Eosinophils Relative 0 %   Eosinophils Absolute 0.0 0.0 - 0.7 K/uL   Basophils Relative 0 %   Basophils Absolute 0.0 0.0 - 0.1 K/uL    Disposition: Rt Internal carotid artery/periophthalmic aneurysm embolization 10/4 in IR with Dr Estanislado Pandy Has done well overnight Plan for discharge today. Continue all home meds Change ASA to 325 mg daily Plavix 75 mg daily 2 week follow up with Dr Evalyn Casco will hear from scheduler with time and date She has good understanding of discharge instructions  Discharge Instructions    Call MD for:  difficulty breathing, headache or visual disturbances    Complete by:  As directed    Call MD for:  extreme fatigue    Complete by:  As directed    Call MD for:  hives    Complete by:  As directed    Call MD for:  persistant dizziness or light-headedness    Complete by:  As directed    Call MD for:  persistant nausea and vomiting    Complete by:  As directed    Call MD for:  redness, tenderness, or signs of infection (pain, swelling, redness, odor or green/yellow discharge around incision site)  Complete by:  As directed    Call MD for:  severe uncontrolled pain    Complete by:  As directed    Call MD for:   temperature >100.4    Complete by:  As directed    Diet - low sodium heart healthy    Complete by:  As directed    Discharge instructions    Complete by:  As directed    Change ASA to 325 mg daily; continue all home meds; 2 week follow up with Dr Jaclyn Prime will hear from scheduler for time and date; call [phone] if issues or concerns   Discharge wound care:    Complete by:  As directed    May shower tomorrow; change band aid at Rt groin daily x 1 week   Driving Restrictions    Complete by:  As directed    No driving x 2 weeks   Increase activity slowly    Complete by:  As directed    Lifting restrictions    Complete by:  As directed    No lifting over 10 lbs x 2 weeks       Medication List    TAKE these medications   atorvastatin 40 MG tablet Commonly known as:  LIPITOR Take 40 mg by mouth every evening.   CALCIUM + D3 PO Take 600 mg by mouth 2 (two) times daily. 600 mg   clopidogrel 75 MG tablet Commonly known as:  PLAVIX Take 37.5-75 mg by mouth See admin instructions. Alternates between 75 mg I day and 37.5 mg the next day   EC-81 ASPIRIN 81 MG EC tablet Generic drug:  aspirin taking 4 tablets daily prior to surgery   Fish Oil 1000 MG Cpdr Take 1 capsule by mouth 2 (two) times daily.   Glucosamine HCl 1000 MG Tabs Take 2 tablets by mouth daily.   lisinopril 40 MG tablet Commonly known as:  PRINIVIL,ZESTRIL Take 40 mg by mouth daily.   metoprolol succinate 100 MG 24 hr tablet Commonly known as:  TOPROL-XL Take 50 mg by mouth every evening.   MIRENA (52 MG) 20 MCG/24HR IUD Generic drug:  levonorgestrel 1 each by Intrauterine route once.   multivitamin tablet Take 1 tablet by mouth daily.   pantoprazole 40 MG tablet Commonly known as:  PROTONIX Take 40 mg by mouth daily.   spironolactone 25 MG tablet Commonly known as:  ALDACTONE Take 0.5 tablets (12.5 mg total) by mouth daily.   venlafaxine XR 75 MG 24 hr capsule Commonly known as:   EFFEXOR-XR Take 75 mg by mouth daily with breakfast.      Follow-up Information    DEVESHWAR, Fritz Pickerel, MD Follow up in 2 week(s).   Specialty:  Interventional Radiology Why:  pt will hear from scheduler for time and date of appt. Call [phone] if questions/concerns Contact information: [address] [place] [address] [phone]            Electronically Signed: Monia Sabal A 05/06/2016, 9:08 AM   I have spent Greater Than 30 Minutes discharging Deborah Thompson.

## 2016-05-06 NOTE — Consult Note (Addendum)
   [facility] CM Inpatient Consult   05/06/2016  Deborah Thompson 1965-11-03 [id number]    Spoke with Ms. Gillean at bedside on behalf of Link to [facility] Care Management program for [facility] employees/dependents with [facility] insurance. Explained Link to [employer]. She denies any Link to Wellness needs. However, agreeable to post hospital follow up call. Confirmed best contact number as [phone]. Provided Link to [employer] and contact information. Appreciative of visit.    Marthenia Rolling, MSN-Ed, RN,BSN [facility] Liaison [phone]

## 2016-05-06 NOTE — Progress Notes (Signed)
Pt received regular tray at 13:50, and then was wheeled out to her daughter's car by [employer]. (at 14:10)

## 2016-05-07 ENCOUNTER — Other Ambulatory Visit: Payer: Self-pay | Admitting: *Deleted

## 2016-05-07 ENCOUNTER — Encounter: Payer: Self-pay | Admitting: *Deleted

## 2016-05-07 NOTE — Patient Outreach (Addendum)
[facility]) Care Management  05/07/2016  Deborah Thompson Sep 30, 1965 [id number]   Subjective: Telephone call to patient's home number, spoke with patient, and HIPAA verified.   Discussed [facility] Care Management [facility] Transition of care follow up and patient in agreement to complete follow up.   Patient states she is doing much better, glad to have procedure behind her, and looking forward to healing.  RNCM educated patient on the importance of hospital follow up with primary MD, patient voiced understanding, and is in agreement.  Patient states she will call primary MD and Dr. Estanislado Pandy to schedule her follow up appointments.  Patient states she is accessing the following [employer] Employee benefits: Critical Illness, Accident policy, [facility] outpatient pharmacy, Family Medical Leave Act [facility]) has been approved and is in place.  Patient states she does not have any transition of care, care coordination, disease management, disease monitoring, transportation, community resource, or pharmacy needs at this time.  States she has [place] Management information and Link to Wellness packet.   She is in agreement to receive successful outreach letter.   States she is very appreciative of the follow up call.  Objective: Per chart review: Patient hospitalized  05/05/16 - 05/06/16 for Right Internal Carotid Artery aneurysm and Periophthalmic aneurysm.   Status post Right Internal carotid artery/periophthalmic aneurysm embolization on 05/05/16.  Patient also has a history of CVA (01/2016), hypertension, and skin cancer.    Assessment:  Received [facility] Transition of care referral on 05/06/16.   Telephone screen / transition of care follow up call completed, no care management needs, and will proceed with case closure.  Plan: RNCM will send patient successful outreach letter. RNCM will send case closure due to follow up completed / no care management needs request to [facility]  Management.   Recardo Linn H. Annia Friendly, BSN, [place] Management [facility] Telephonic CM Phone: [phone] Fax: [phone]

## 2016-05-10 ENCOUNTER — Encounter (HOSPITAL_COMMUNITY): Payer: Self-pay | Admitting: Interventional Radiology

## 2016-05-17 MED FILL — CLOPIDOGREL 75 MG TABLET: 75 | 90 days supply | Qty: 90 | Fill #1

## 2016-05-17 MED FILL — ATORVASTATIN 40 MG TABLET: 40 | 90 days supply | Qty: 90 | Fill #2

## 2016-05-20 ENCOUNTER — Ambulatory Visit (HOSPITAL_COMMUNITY)
Admission: RE | Admit: 2016-05-20 | Discharge: 2016-05-20 | Disposition: A | Discharge status: Home / Self Care | Payer: 59 | Source: Ambulatory Visit | Attending: Radiology | Admitting: Radiology

## 2016-05-20 DIAGNOSIS — I671 Cerebral aneurysm, nonruptured: Secondary | ICD-10-CM | POA: Diagnosis not present

## 2016-05-20 DIAGNOSIS — Z48812 Encounter for surgical aftercare following surgery on the circulatory system: Secondary | ICD-10-CM | POA: Insufficient documentation

## 2016-05-20 HISTORY — PX: IR GENERIC HISTORICAL: IMG1180011

## 2016-05-20 LAB — PLATELET INHIBITION P2Y12: PLATELET FUNCTION P2Y12: 0 [PRU] — AB (ref 194–418)

## 2016-05-20 NOTE — Progress Notes (Signed)
Pt in Radiology Dept to have R groin check by RN Healed incision seen upon inspection,  no tenderness or discomfort, no swelling or redness noted. Pt just said she wanted "to be sure it looked ok". MD aware and in agreement. Pt had angiogram 2 weeks ago

## 2016-05-25 ENCOUNTER — Ambulatory Visit (HOSPITAL_COMMUNITY): Admission: RE | Admit: 2016-05-25 | Payer: 59 | Source: Ambulatory Visit

## 2016-05-26 MED FILL — VENLAFAXINE HCL ER 75 MG CA: 75 | 90 days supply | Qty: 90 | Fill #3

## 2016-05-26 MED FILL — METOPROLOL SUCC ER 100 MG T: 100 | 30 days supply | Qty: 45 | Fill #2

## 2016-05-31 ENCOUNTER — Encounter (HOSPITAL_COMMUNITY): Payer: Self-pay | Admitting: Interventional Radiology

## 2016-06-03 ENCOUNTER — Encounter: Payer: Self-pay | Admitting: Neurology

## 2016-06-03 ENCOUNTER — Ambulatory Visit (INDEPENDENT_AMBULATORY_CARE_PROVIDER_SITE_OTHER): Payer: 59 | Admitting: Neurology

## 2016-06-03 VITALS — BP 110/62 | HR 74 | Ht 66.5 in | Wt 170.0 lb

## 2016-06-03 DIAGNOSIS — I6320 Cerebral infarction due to unspecified occlusion or stenosis of unspecified precerebral arteries: Secondary | ICD-10-CM

## 2016-06-03 DIAGNOSIS — I10 Essential (primary) hypertension: Secondary | ICD-10-CM

## 2016-06-03 DIAGNOSIS — I729 Aneurysm of unspecified site: Secondary | ICD-10-CM | POA: Diagnosis not present

## 2016-06-03 DIAGNOSIS — E7800 Pure hypercholesterolemia, unspecified: Secondary | ICD-10-CM

## 2016-06-03 NOTE — Progress Notes (Signed)
Chart forwarded.  

## 2016-06-03 NOTE — Patient Instructions (Signed)
1.  Continue Plavix and aspirin as per Dr. Estanislado Pandy 2.  Continue atorvastatin for cholesterol 3.  Continue blood pressure control 4.  Exercise 5.  Mediterranean diet    Why follow it? Research shows. . Those who follow the Mediterranean diet have a reduced risk of heart disease  . The diet is associated with a reduced incidence of Parkinson's and Alzheimer's diseases . People following the diet may have longer life expectancies and lower rates of chronic diseases  . The Dietary Guidelines for Americans recommends the Mediterranean diet as an eating plan to promote health and prevent disease  What Is the Mediterranean Diet?  . Healthy eating plan based on typical foods and recipes of Mediterranean-style cooking . The diet is primarily a plant based diet; these foods should make up a majority of meals   Starches - Plant based foods should make up a majority of meals - They are an important sources of vitamins, minerals, energy, antioxidants, and fiber - Choose whole grains, foods high in fiber and minimally processed items  - Typical grain sources include wheat, oats, barley, corn, brown rice, bulgar, farro, millet, polenta, couscous  - Various types of beans include chickpeas, lentils, fava beans, black beans, white beans   Fruits  Veggies - Large quantities of antioxidant rich fruits & veggies; 6 or more servings  - Vegetables can be eaten raw or lightly drizzled with oil and cooked  - Vegetables common to the traditional Mediterranean Diet include: artichokes, arugula, beets, broccoli, brussel sprouts, cabbage, carrots, celery, collard greens, cucumbers, eggplant, kale, leeks, lemons, lettuce, mushrooms, okra, onions, peas, peppers, potatoes, pumpkin, radishes, rutabaga, shallots, spinach, sweet potatoes, turnips, zucchini - Fruits common to the Mediterranean Diet include: apples, apricots, avocados, cherries, clementines, dates, figs, grapefruits, grapes, melons, nectarines, oranges,  peaches, pears, pomegranates, strawberries, tangerines  Fats - Replace butter and margarine with healthy oils, such as olive oil, canola oil, and tahini  - Limit nuts to no more than a handful a day  - Nuts include walnuts, almonds, pecans, pistachios, pine nuts  - Limit or avoid candied, honey roasted or heavily salted nuts - Olives are central to the [employer] - can be eaten whole or used in a variety of dishes   Meats Protein - Limiting red meat: no more than a few times a month - When eating red meat: choose lean cuts and keep the portion to the size of deck of cards - Eggs: approx. 0 to 4 times a week  - Fish and lean poultry: at least 2 a week  - Healthy protein sources include, chicken, [place], lean beef, lamb - Increase intake of seafood such as tuna, salmon, trout, mackerel, shrimp, scallops - Avoid or limit high fat processed meats such as sausage and bacon  Dairy - Include moderate amounts of low fat dairy products  - Focus on healthy dairy such as fat free yogurt, skim milk, low or reduced fat cheese - Limit dairy products higher in fat such as whole or 2% milk, cheese, ice cream  Alcohol - Moderate amounts of red wine is ok  - No more than 5 oz daily for women (all ages) and men older than age 87  - No more than 10 oz of wine daily for men younger than 14  Other - Limit sweets and other desserts  - Use herbs and spices instead of salt to flavor foods  - Herbs and spices common to the traditional Mediterranean Diet include: basil, bay leaves, chives,  cloves, cumin, fennel, garlic, lavender, marjoram, mint, oregano, parsley, pepper, rosemary, sage, savory, sumac, tarragon, thyme   It's not just a diet, it's a lifestyle:  . The Mediterranean diet includes lifestyle factors typical of those in the region  . Foods, drinks and meals are best eaten with others and savored . Daily physical activity is important for overall good health . This could be strenuous exercise like  running and aerobics . This could also be more leisurely activities such as walking, housework, yard-work, or taking the stairs . Moderation is the key; a balanced and healthy diet accommodates most foods and drinks . Consider portion sizes and frequency of consumption of certain foods   Meal Ideas & Options:  . Breakfast:  o Whole wheat toast or whole wheat English muffins with peanut butter & hard boiled egg o Steel cut oats topped with apples & cinnamon and skim milk  o Fresh fruit: banana, strawberries, melon, berries, peaches  o Smoothies: strawberries, bananas, greek yogurt, peanut butter o Low fat greek yogurt with blueberries and granola  o Egg white omelet with spinach and mushrooms o Breakfast couscous: whole wheat couscous, apricots, skim milk, cranberries  . Sandwiches:  o Hummus and grilled vegetables (peppers, zucchini, squash) on whole wheat bread   o Grilled chicken on whole wheat pita with lettuce, tomatoes, cucumbers or tzatziki  o Tuna salad on whole wheat bread: tuna salad made with greek yogurt, olives, red peppers, capers, green onions o Garlic rosemary lamb pita: lamb sauted with garlic, rosemary, salt & pepper; add lettuce, cucumber, greek yogurt to pita - flavor with lemon juice and black pepper  . Seafood:  o Mediterranean grilled salmon, seasoned with garlic, basil, parsley, lemon juice and black pepper o Shrimp, lemon, and spinach whole-grain pasta salad made with low fat greek yogurt  o Seared scallops with lemon orzo  o Seared tuna steaks seasoned salt, pepper, coriander topped with tomato mixture of olives, tomatoes, olive oil, minced garlic, parsley, green onions and cappers  . Meats:  o Herbed greek chicken salad with kalamata olives, cucumber, feta  o Red bell peppers stuffed with spinach, bulgur, lean ground beef (or lentils) & topped with feta   o Kebabs: skewers of chicken, tomatoes, onions, zucchini, squash  o [place] burgers: made with red onions,  mint, dill, lemon juice, feta cheese topped with roasted red peppers . Vegetarian o Cucumber salad: cucumbers, artichoke hearts, celery, red onion, feta cheese, tossed in olive oil & lemon juice  o Hummus and whole grain pita points with a greek salad (lettuce, tomato, feta, olives, cucumbers, red onion) o Lentil soup with celery, carrots made with vegetable broth, garlic, salt and pepper  o Tabouli salad: parsley, bulgur, mint, scallions, cucumbers, tomato, radishes, lemon juice, olive oil, salt and pepper. 6.  Follow up in June

## 2016-06-03 NOTE — Progress Notes (Signed)
NEUROLOGY FOLLOW UP OFFICE NOTE  ENYAH NEDD KF:[id number]  HISTORY OF PRESENT ILLNESS: Deborah Thompson is a 50 year old left-handed woman with hypertension, hypercholesterolemia, depression and GERD who follows up for stroke and cerebral aneurysm.  UPDATE: 03/02/16:  LDL 74.  Hgb A1c 5.9.  MRI of brain with and without contrast was performed on 03/23/16, which was personally reviewed and confirmed ischemic stroke in the left basal ganglia.  MRA of brain was personally reviewed and revealed a 5 mm right ICA para-ophthalmic aneurysm and left ICA siphon irregularity with moderate stenosis.    She was referred to neuro-endovascular.  She underwent angiogram of the common carotid and vertebral arteries on 04/07/16 by Dr. Estanislado Pandy.  Findings revealed a "5.8 mm x 4.5 mm wide neck bilobed saccular aneurysm arising in the right para ophthalmic internal carotid artery distal to the origin of the ophthalmic artery" and a "(S)mall saccular outpouching arising from the posterior wall of the left internal carotid artery distal to the origin of the ophthalmic artery".  It also revealed a "50% stenosis of the proximal left ICA supraclinoid segement".  She followed with Dr. Estanislado Pandy.  She underwent endovascular embolization of the RICA periophthalmic aneurysm with pipeline flow diverter devise on 05/05/16.  Incidentally, the found another aneurysm of the right ICA as well, which is being monitored.  She is currently on ASA 325mg  daily and Plavix 75mg  daily.  Plavix will be discontinued at end of year and plan is to remain on ASA 325mg  daily.  She also is taking atorvastatin 40mg  daily.  HISTORY: In June 2017, she woke up one morning with one of her habitual headaches, which she has had since childhood.  It resolved.  About a couple of days later, she noticed problems with memory and confusion.  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   She also had word-finding difficulties.  Other people thought she did not act like herself.  She seemed more quiet and withdrawn.  She denied lateralize weakness, visual disturbance or gait instability.   MRI of brain with and without contrast from 01/24/16 was personally reviewed and showed abnormal signal in the left basal ganglia, consistent with subacute infarct.  2D echo performed on 01/30/16 showed EF 60-65% with no evidence for cardiac source of emboli.  Carotid doppler showed no hemodynamically significant stenosis.   She still reports mild word-finding difficulties but otherwise she feels better.   CBC showed WBC 7.7, HGB 13.1, HCT 39.9 and PLT 313;  CMP showed Na 141, K 3.7, Cl 103, CO2 33, glucose 111, BUN 17, Cr 0.90, TB 0.6, ALP 66, AST 25 and ALT 26;  TSH 1.32.   She takes Lipitor 40mg  daily.  Lipid panel from 08/20/15 showed cholesterol 177, TG 233, HDL 42 and LDL 89.   Since discovery of stroke, she was started on ASA 81mg  daily.   She reports palpitations in the past, even this year, but not since the stroke. She denies family history of stroke.  She is a former smoker, for 3 or 4 years, but quit 30 years ago.  PAST MEDICAL HISTORY: Past Medical History:  Diagnosis Date  . Cardiomyopathy [facility])    after son was born  . Depression   . GERD (gastroesophageal reflux disease)   . History of gestational diabetes   . History of kidney stones   . Hypercholesterolemia   . Hypertension   .  Migraines   . Preeclampsia   . Skin cancer   . Stroke [facility])    reports that she had difficulty in making words    MEDICATIONS: Current Outpatient Prescriptions on File Prior to Visit  Medication Sig Dispense Refill  . aspirin (EC-81 ASPIRIN) 81 MG EC tablet taking 4 tablets daily prior to surgery    . atorvastatin (LIPITOR) 40 MG tablet Take 40 mg by mouth every evening.     . Calcium Carb-Cholecalciferol (CALCIUM + D3 PO) Take 600 mg by mouth 2 (two) times daily. 600 mg     . clopidogrel  (PLAVIX) 75 MG tablet Take 37.5-75 mg by mouth See admin instructions. Alternates between 75 mg I day and 37.5 mg the next day    . Glucosamine HCl 1000 MG TABS Take 2 tablets by mouth daily.    Marland Kitchen levonorgestrel (MIRENA, 52 MG,) 20 MCG/24HR IUD 1 each by Intrauterine route once.    Marland Kitchen lisinopril (PRINIVIL,ZESTRIL) 40 MG tablet Take 40 mg by mouth daily.   1  . metoprolol succinate (TOPROL-XL) 100 MG 24 hr tablet Take 50 mg by mouth every evening.     . Multiple Vitamin (MULTIVITAMIN) tablet Take 1 tablet by mouth daily.    . Omega-3 Fatty Acids (FISH OIL) 1000 MG CPDR Take 1 capsule by mouth 2 (two) times daily.     . pantoprazole (PROTONIX) 40 MG tablet Take 40 mg by mouth daily.    Marland Kitchen spironolactone (ALDACTONE) 25 MG tablet Take 0.5 tablets (12.5 mg total) by mouth daily. 45 tablet 3  . venlafaxine XR (EFFEXOR-XR) 75 MG 24 hr capsule Take 75 mg by mouth daily with breakfast.     No current facility-administered medications on file prior to visit.     ALLERGIES: No Known Allergies  FAMILY HISTORY: Family History  Problem Relation Age of Onset  . Heart disease Mother   . Heart disease Father   . Colon cancer Neg Hx     SOCIAL HISTORY: Social History   Social History  . Marital status: Divorced    Spouse name: N/A  . Number of children: N/A  . Years of education: N/A   Occupational History  . Not on file.   Social History Main Topics  . Smoking status: Former Smoker    Packs/day: 1.00    Years: 4.00    Types: Cigarettes  . Smokeless tobacco: Never Used  . Alcohol use Yes     Comment: SOCIALLY  . Drug use: No  . Sexual activity: Not on file   Other Topics Concern  . Not on file   Social History Narrative  . No narrative on file    REVIEW OF SYSTEMS: Constitutional: No fevers, chills, or sweats, no generalized fatigue, change in appetite Eyes: No visual changes, double vision, eye pain Ear, nose and throat: No hearing loss, ear pain, nasal congestion, sore  throat Cardiovascular: No chest pain, palpitations Respiratory:  No shortness of breath at rest or with exertion, wheezes GastrointestinaI: No nausea, vomiting, diarrhea, abdominal pain, fecal incontinence Genitourinary:  No dysuria, urinary retention or frequency Musculoskeletal:  No neck pain, back pain Integumentary: No rash, pruritus, skin lesions Neurological: as above Psychiatric: No depression, insomnia, anxiety Endocrine: No palpitations, fatigue, diaphoresis, mood swings, change in appetite, change in weight, increased thirst Hematologic/Lymphatic:  No purpura, petechiae. Allergic/Immunologic: no itchy/runny eyes, nasal congestion, recent allergic reactions, rashes  PHYSICAL EXAM: Vitals:   06/03/16 0832  BP: 110/62  Pulse: 74   General: No  acute distress.  Patient appears well-groomed.  Head:  Normocephalic/atraumatic Eyes:  Fundi examined but not visualized Neck: supple, no paraspinal tenderness, full range of motion Heart:  Regular rate and rhythm Lungs:  Clear to auscultation bilaterally Back: No paraspinal tenderness Neurological Exam: alert and oriented to person, place, and time. Attention span and concentration intact, recent and remote memory intact, fund of knowledge intact.  Speech fluent and not dysarthric, language intact.  CN II-XII intact. Bulk and tone normal, muscle strength 5/5 throughout.  Sensation to light touch  intact.  Deep tendon reflexes 2+ throughout.  Finger to nose testing intact.  Gait normal, Romberg negative.  IMPRESSION: Left basal ganglia stroke secondary to small vessel disease or possible left ICA stenosis Cerebral aneurysm Hyperlipidemia Hypertension  PLAN: 1.  Continue ASA 325mg  daily and Plavix  2.  Atorvastatin 3.  Blood pressure control 4.  Mediterranean diet 5.  Exercise 6.  Follow up in June  25 minutes spent face to face with patient, over 50% spent counseling.  Metta Clines, DO  CC:  Gaynelle Arabian, MD

## 2016-06-07 DIAGNOSIS — Z760 Encounter for issue of repeat prescription: Secondary | ICD-10-CM | POA: Diagnosis not present

## 2016-06-14 MED FILL — PANTOPRAZOLE SOD DR 40 MG T: 40 | 90 days supply | Qty: 90 | Fill #3

## 2016-06-21 ENCOUNTER — Telehealth (HOSPITAL_COMMUNITY): Payer: Self-pay | Admitting: Radiology

## 2016-06-21 NOTE — Telephone Encounter (Signed)
Pt was notified to continue on her Plavix 75mg  1 daily but to change her Aspirin dose from 325mg  daily to 81mg  daily. She states understanding. She will come back in for a recheck P2Y12 blood test on 06/29/16. JM

## 2016-06-30 ENCOUNTER — Other Ambulatory Visit: Payer: Self-pay | Admitting: Physician Assistant

## 2016-06-30 LAB — PLATELET INHIBITION P2Y12: Platelet Function  P2Y12: 6 [PRU] — ABNORMAL LOW (ref 194–418)

## 2016-07-19 MED FILL — LISINOPRIL 40 MG TABLET: 40 | 90 days supply | Qty: 90 | Fill #2

## 2016-07-23 ENCOUNTER — Other Ambulatory Visit (HOSPITAL_COMMUNITY): Payer: Self-pay | Admitting: Interventional Radiology

## 2016-07-23 ENCOUNTER — Other Ambulatory Visit: Payer: Self-pay | Admitting: Radiology

## 2016-07-23 DIAGNOSIS — I671 Cerebral aneurysm, nonruptured: Secondary | ICD-10-CM

## 2016-07-23 LAB — PLATELET INHIBITION P2Y12: Platelet Function  P2Y12: 1 [PRU] — ABNORMAL LOW (ref 194–418)

## 2016-07-30 ENCOUNTER — Ambulatory Visit (HOSPITAL_COMMUNITY)
Admission: RE | Admit: 2016-07-30 | Discharge: 2016-07-30 | Disposition: A | Discharge status: Home / Self Care | Payer: 59 | Source: Ambulatory Visit | Attending: Interventional Radiology | Admitting: Interventional Radiology

## 2016-07-30 DIAGNOSIS — I671 Cerebral aneurysm, nonruptured: Secondary | ICD-10-CM | POA: Insufficient documentation

## 2016-07-30 DIAGNOSIS — Z8673 Personal history of transient ischemic attack (TIA), and cerebral infarction without residual deficits: Secondary | ICD-10-CM | POA: Insufficient documentation

## 2016-07-30 LAB — POCT I-STAT CREATININE: CREATININE: 0.8 mg/dL (ref 0.44–1.00)

## 2016-07-30 MED ORDER — GADOBENATE DIMEGLUMINE 529 MG/ML IV SOLN
16.0000 mL | Freq: Once | INTRAVENOUS | Status: AC | PRN
  Administered 2016-07-30: 16 mL via INTRAVENOUS

## 2016-08-03 MED FILL — SPIRONOLACTONE 25 MG TABLET: 25 | 90 days supply | Qty: 45 | Fill #3

## 2016-08-16 MED FILL — ATORVASTATIN 40 MG TABLET: 40 | 90 days supply | Qty: 90 | Fill #3

## 2016-08-19 MED FILL — VENLAFAXINE HCL ER 75 MG CA: 75 | 90 days supply | Qty: 90 | Fill #0

## 2016-08-26 ENCOUNTER — Telehealth (HOSPITAL_COMMUNITY): Payer: Self-pay

## 2016-08-26 NOTE — Telephone Encounter (Signed)
Left message for pt to return call. AW 

## 2016-09-01 MED FILL — METOPROLOL SUCC ER 100 MG T: 100 | 30 days supply | Qty: 45 | Fill #0

## 2016-09-10 DIAGNOSIS — E7800 Pure hypercholesterolemia, unspecified: Secondary | ICD-10-CM | POA: Diagnosis not present

## 2016-09-10 DIAGNOSIS — I639 Cerebral infarction, unspecified: Secondary | ICD-10-CM | POA: Diagnosis not present

## 2016-09-10 DIAGNOSIS — F322 Major depressive disorder, single episode, severe without psychotic features: Secondary | ICD-10-CM | POA: Diagnosis not present

## 2016-09-10 DIAGNOSIS — Z0000 Encounter for general adult medical examination without abnormal findings: Secondary | ICD-10-CM | POA: Diagnosis not present

## 2016-09-10 DIAGNOSIS — I671 Cerebral aneurysm, nonruptured: Secondary | ICD-10-CM | POA: Diagnosis not present

## 2016-09-10 DIAGNOSIS — K219 Gastro-esophageal reflux disease without esophagitis: Secondary | ICD-10-CM | POA: Diagnosis not present

## 2016-09-10 DIAGNOSIS — G43909 Migraine, unspecified, not intractable, without status migrainosus: Secondary | ICD-10-CM | POA: Diagnosis not present

## 2016-09-10 DIAGNOSIS — I10 Essential (primary) hypertension: Secondary | ICD-10-CM | POA: Diagnosis not present

## 2016-09-24 MED FILL — PANTOPRAZOLE SOD DR 40 MG T: 40 | 90 days supply | Qty: 90 | Fill #0

## 2016-10-04 DIAGNOSIS — H5213 Myopia, bilateral: Secondary | ICD-10-CM | POA: Diagnosis not present

## 2016-10-04 DIAGNOSIS — H524 Presbyopia: Secondary | ICD-10-CM | POA: Diagnosis not present

## 2016-10-18 MED FILL — LISINOPRIL 40 MG TABLET: 40 | 90 days supply | Qty: 90 | Fill #0

## 2016-11-01 MED FILL — SPIRONOLACTONE 25 MG TABLET: 25 | 90 days supply | Qty: 45 | Fill #0

## 2016-11-22 DIAGNOSIS — R232 Flushing: Secondary | ICD-10-CM | POA: Diagnosis not present

## 2016-11-22 DIAGNOSIS — Z1151 Encounter for screening for human papillomavirus (HPV): Secondary | ICD-10-CM | POA: Diagnosis not present

## 2016-11-22 DIAGNOSIS — Z01419 Encounter for gynecological examination (general) (routine) without abnormal findings: Secondary | ICD-10-CM | POA: Diagnosis not present

## 2016-11-22 DIAGNOSIS — Z1231 Encounter for screening mammogram for malignant neoplasm of breast: Secondary | ICD-10-CM | POA: Diagnosis not present

## 2016-11-22 DIAGNOSIS — Z6828 Body mass index (BMI) 28.0-28.9, adult: Secondary | ICD-10-CM | POA: Diagnosis not present

## 2016-11-22 MED FILL — VENLAFAXINE HCL ER 75 MG CA: 75 | 90 days supply | Qty: 90 | Fill #1

## 2016-11-22 MED FILL — ATORVASTATIN 40 MG TABLET: 40 | 90 days supply | Qty: 90 | Fill #0

## 2016-12-10 MED FILL — METOPROLOL SUCC ER 100 MG T: 100 | 30 days supply | Qty: 45 | Fill #1

## 2017-01-03 ENCOUNTER — Encounter: Payer: Self-pay | Admitting: Neurology

## 2017-01-03 ENCOUNTER — Ambulatory Visit (INDEPENDENT_AMBULATORY_CARE_PROVIDER_SITE_OTHER): Payer: 59 | Admitting: Neurology

## 2017-01-03 VITALS — BP 92/60 | HR 62 | Ht 66.5 in | Wt 178.0 lb

## 2017-01-03 DIAGNOSIS — I729 Aneurysm of unspecified site: Secondary | ICD-10-CM | POA: Diagnosis not present

## 2017-01-03 DIAGNOSIS — I10 Essential (primary) hypertension: Secondary | ICD-10-CM

## 2017-01-03 DIAGNOSIS — I639 Cerebral infarction, unspecified: Secondary | ICD-10-CM

## 2017-01-03 DIAGNOSIS — E7800 Pure hypercholesterolemia, unspecified: Secondary | ICD-10-CM

## 2017-01-03 MED FILL — PANTOPRAZOLE SOD DR 40 MG T: 40 | 90 days supply | Qty: 90 | Fill #1

## 2017-01-03 NOTE — Progress Notes (Signed)
NEUROLOGY FOLLOW UP OFFICE NOTE  Deborah Thompson [id number]  HISTORY OF PRESENT ILLNESS: Deborah Thompson is a 51 year old left-handed woman with hypertension, hypercholesterolemia, depression and GERD who follows up for stroke and cerebral aneurysm.   UPDATE: She is currently on ASA 325mg  daily and atorvastatin 40mg  daily.  Physically, she is doing well.  However, she notes depressed mood.  Some of it is related to work.  She hasn't been exercising.     HISTORY: In June 2017, she woke up one morning with one of her habitual headaches, which she has had since childhood.  It resolved.  About a couple of days later, she noticed problems with memory and confusion.  She is an [place] and when she would answer the phone at work, she forgot how she is supposed to respond.  She also had word-finding difficulties.  Other people thought she did not act like herself.  She seemed more quiet and withdrawn.  She denied lateralize weakness, visual disturbance or gait instability.   MRI of brain with and without contrast from 01/24/16 showed abnormal signal in the left basal ganglia, consistent with subacute infarct.  Repeat MRI of brain with and without contrast was performed on 03/23/16 confirmed ischemic stroke in the left basal ganglia.  MRA of brain revealed a 5 mm right ICA para-ophthalmic aneurysm and left ICA siphon irregularity with moderate stenosis.  2D echo on 01/30/16 showed EF 60-65% with no evidence for cardiac source of emboli.  Carotid doppler showed no hemodynamically significant stenosis.  Labs from 03/02/16 included  LDL 74 and Hgb A1c 5.9.  She was referred to neuro-endovascular.  She underwent angiogram of the common carotid and vertebral arteries on 04/07/16 by Dr. Estanislado Pandy.  Findings revealed a "5.8 mm x 4.5 mm wide neck bilobed saccular aneurysm arising in the right para ophthalmic internal carotid artery distal to the origin of the ophthalmic artery" and a "(S)mall saccular  outpouching arising from the posterior wall of the left internal carotid artery distal to the origin of the ophthalmic artery".  It also revealed a "50% stenosis of the proximal left ICA supraclinoid segement".  She followed with Dr. Estanislado Pandy.  She underwent endovascular embolization of the RICA periophthalmic aneurysm with pipeline flow diverter devise on 05/05/16.  Incidentally, the found another aneurysm of the right ICA as well, which is being monitored.   She reports palpitations in the past, even this year, but not since the stroke. She denies family history of stroke.  She is a former smoker, for 3 or 4 years, but quit 30 years ago.  PAST MEDICAL HISTORY: Past Medical History:  Diagnosis Date  . Cardiomyopathy [facility])    after son was born  . Depression   . GERD (gastroesophageal reflux disease)   . History of gestational diabetes   . History of kidney stones   . Hypercholesterolemia   . Hypertension   . Migraines   . Preeclampsia   . Skin cancer   . Stroke [facility])    reports that she had difficulty in making words    MEDICATIONS: Current Outpatient Prescriptions on File Prior to Visit  Medication Sig Dispense Refill  . aspirin (EC-81 ASPIRIN) 81 MG EC tablet taking 4 tablets daily prior to surgery    . atorvastatin (LIPITOR) 40 MG tablet Take 40 mg by mouth every evening.     . Calcium Carb-Cholecalciferol (CALCIUM + D3 PO) Take 600 mg by mouth 2 (two) times daily. 600 mg     .  Glucosamine HCl 1000 MG TABS Take 2 tablets by mouth daily.    Marland Kitchen levonorgestrel (MIRENA, 52 MG,) 20 MCG/24HR IUD 1 each by Intrauterine route once.    Marland Kitchen lisinopril (PRINIVIL,ZESTRIL) 40 MG tablet Take 40 mg by mouth daily.   1  . metoprolol succinate (TOPROL-XL) 100 MG 24 hr tablet Take 50 mg by mouth every evening.     . Multiple Vitamin (MULTIVITAMIN) tablet Take 1 tablet by mouth daily.    . Omega-3 Fatty Acids (FISH OIL) 1000 MG CPDR Take 1 capsule by mouth 2 (two) times daily.     . pantoprazole  (PROTONIX) 40 MG tablet Take 40 mg by mouth daily.    Marland Kitchen spironolactone (ALDACTONE) 25 MG tablet Take 0.5 tablets (12.5 mg total) by mouth daily. 45 tablet 3  . venlafaxine XR (EFFEXOR-XR) 75 MG 24 hr capsule Take 75 mg by mouth daily with breakfast.     No current facility-administered medications on file prior to visit.     ALLERGIES: No Known Allergies  FAMILY HISTORY: Family History  Problem Relation Age of Onset  . Heart disease Mother   . Heart disease Father   . Colon cancer Neg Hx     SOCIAL HISTORY: Social History   Social History  . Marital status: Divorced    Spouse name: N/A  . Number of children: N/A  . Years of education: N/A   Occupational History  . Not on file.   Social History Main Topics  . Smoking status: Former Smoker    Packs/day: 1.00    Years: 4.00    Types: Cigarettes  . Smokeless tobacco: Never Used  . Alcohol use Yes     Comment: SOCIALLY  . Drug use: No  . Sexual activity: Not on file   Other Topics Concern  . Not on file   Social History Narrative  . No narrative on file    REVIEW OF SYSTEMS: Constitutional: No fevers, chills, or sweats, no generalized fatigue, change in appetite Eyes: No visual changes, double vision, eye pain Ear, nose and throat: No hearing loss, ear pain, nasal congestion, sore throat Cardiovascular: No chest pain, palpitations Respiratory:  No shortness of breath at rest or with exertion, wheezes GastrointestinaI: No nausea, vomiting, diarrhea, abdominal pain, fecal incontinence Genitourinary:  No dysuria, urinary retention or frequency Musculoskeletal:  No neck pain, back pain Integumentary: No rash, pruritus, skin lesions Neurological: as above Psychiatric: depressed mood. Endocrine: No palpitations, fatigue, diaphoresis, mood swings, change in appetite, change in weight, increased thirst Hematologic/Lymphatic:  No purpura, petechiae. Allergic/Immunologic: no itchy/runny eyes, nasal congestion, recent  allergic reactions, rashes  PHYSICAL EXAM: Vitals:   01/03/17 0840  BP: 92/60  Pulse: 62   General: No acute distress.  Patient appears well-groomed.  normal body habitus. Head:  Normocephalic/atraumatic Eyes:  Fundi examined but not visualized Neck: supple, no paraspinal tenderness, full range of motion Heart:  Regular rate and rhythm Lungs:  Clear to auscultation bilaterally Back: No paraspinal tenderness Neurological Exam: alert and oriented to person, place, and time. Attention span and concentration intact, recent and remote memory intact, fund of knowledge intact.  Speech fluent and not dysarthric, language intact.  CN II-XII intact. Bulk and tone normal, muscle strength 5/5 throughout.  Sensation to light touch, temperature and vibration intact.  Deep tendon reflexes 2+ throughout, toes downgoing.  Finger to nose and heel to shin testing intact.  Gait normal, Romberg negative.  IMPRESSION: 1. Left basal ganglia stroke.  Cryptogenic.  Stroke involves  the entire basal ganglia, making small vessel disease less likely.  Possibly embolic. 2.  aneurysm 3.  Hypertension, controlled.  Blood pressure running low but patient denies dizziness or lightheadedness. 4.  Hyperlipidemia 5.  Depressed mood  PLAN: Secondary stroke management may be managed by PCP. 1.  ASA 325mg  daily for secondary stroke prevention 2.  Atorvastatin (LDL goal less than 70) 3.  Continue blood pressure control.  Monitor for lightheadedness/dizziness. 4.  Mediterranean diet 5.  Exercise 6.  Consider increasing dose of venlafaxine (such as to 150mg  daily) 7.  Monitor aneurysm with Dr. Estanislado Pandy. 8.  Follow up as needed.  18 minutes spent face to face with patient, over 50% spent discussing management.  Metta Clines, DO  CC:  Gaynelle Arabian, MD

## 2017-01-03 NOTE — Patient Instructions (Addendum)
1.  Continue aspirin 325mg  daily 2.  Continue Lipitor 3.  Maintain controlled blood pressure.  If you feel lightheaded, contact Dr. [name]. 4.  Exercise 5. Mediterranean diet as discussed 6.  Follow up with Dr. Estanislado Pandy 7.  Discuss with Dr. [name] about possibly increasing Effexor 8.  Follow up as needed.

## 2017-01-06 DIAGNOSIS — H35371 Puckering of macula, right eye: Secondary | ICD-10-CM | POA: Diagnosis not present

## 2017-01-06 DIAGNOSIS — H43813 Vitreous degeneration, bilateral: Secondary | ICD-10-CM | POA: Diagnosis not present

## 2017-01-24 MED FILL — LISINOPRIL 40 MG TAB: 40 | 90 days supply | Qty: 90 | Fill #1

## 2017-01-31 MED FILL — SPIRONOLACTONE 25 MG TABLET: 25 | 90 days supply | Qty: 45 | Fill #1

## 2017-02-21 DIAGNOSIS — R1084 Generalized abdominal pain: Secondary | ICD-10-CM | POA: Diagnosis not present

## 2017-02-21 MED FILL — VENLAFAXINE HCL ER 75 MG CA: 75 | 90 days supply | Qty: 90 | Fill #0

## 2017-02-21 MED FILL — ATORVASTATIN 40 MG TABLET: 40 | 90 days supply | Qty: 90 | Fill #1

## 2017-03-07 MED FILL — METOPROLOL SUCC ER 100 MG T: 100 | 30 days supply | Qty: 45 | Fill #2

## 2017-03-08 DIAGNOSIS — M9903 Segmental and somatic dysfunction of lumbar region: Secondary | ICD-10-CM | POA: Diagnosis not present

## 2017-03-08 DIAGNOSIS — M25561 Pain in right knee: Secondary | ICD-10-CM | POA: Diagnosis not present

## 2017-03-08 DIAGNOSIS — M25562 Pain in left knee: Secondary | ICD-10-CM | POA: Diagnosis not present

## 2017-03-08 DIAGNOSIS — M545 Low back pain: Secondary | ICD-10-CM | POA: Diagnosis not present

## 2017-03-17 DIAGNOSIS — G43909 Migraine, unspecified, not intractable, without status migrainosus: Secondary | ICD-10-CM | POA: Diagnosis not present

## 2017-03-17 DIAGNOSIS — R1032 Left lower quadrant pain: Secondary | ICD-10-CM | POA: Diagnosis not present

## 2017-03-17 DIAGNOSIS — M545 Low back pain: Secondary | ICD-10-CM | POA: Diagnosis not present

## 2017-03-17 DIAGNOSIS — Z8673 Personal history of transient ischemic attack (TIA), and cerebral infarction without residual deficits: Secondary | ICD-10-CM | POA: Diagnosis not present

## 2017-03-17 DIAGNOSIS — I10 Essential (primary) hypertension: Secondary | ICD-10-CM | POA: Diagnosis not present

## 2017-03-17 DIAGNOSIS — F322 Major depressive disorder, single episode, severe without psychotic features: Secondary | ICD-10-CM | POA: Diagnosis not present

## 2017-03-17 DIAGNOSIS — I671 Cerebral aneurysm, nonruptured: Secondary | ICD-10-CM | POA: Diagnosis not present

## 2017-03-17 DIAGNOSIS — K219 Gastro-esophageal reflux disease without esophagitis: Secondary | ICD-10-CM | POA: Diagnosis not present

## 2017-03-17 DIAGNOSIS — M9903 Segmental and somatic dysfunction of lumbar region: Secondary | ICD-10-CM | POA: Diagnosis not present

## 2017-03-17 DIAGNOSIS — M25561 Pain in right knee: Secondary | ICD-10-CM | POA: Diagnosis not present

## 2017-03-17 DIAGNOSIS — E7800 Pure hypercholesterolemia, unspecified: Secondary | ICD-10-CM | POA: Diagnosis not present

## 2017-03-17 DIAGNOSIS — M25562 Pain in left knee: Secondary | ICD-10-CM | POA: Diagnosis not present

## 2017-03-17 MED FILL — metroNIDAZOLE 500 MG TABS: 500 | 10 days supply | Qty: 20 | Fill #0

## 2017-03-17 MED FILL — CIPROFLOXACIN HCL 500 MG TA: 500 | 10 days supply | Qty: 20 | Fill #0

## 2017-04-05 MED FILL — PANTOPRAZOLE SOD DR 40 MG T: 40 | 90 days supply | Qty: 90 | Fill #2

## 2017-04-14 DIAGNOSIS — M545 Low back pain: Secondary | ICD-10-CM | POA: Diagnosis not present

## 2017-04-14 DIAGNOSIS — M25562 Pain in left knee: Secondary | ICD-10-CM | POA: Diagnosis not present

## 2017-04-14 DIAGNOSIS — M9903 Segmental and somatic dysfunction of lumbar region: Secondary | ICD-10-CM | POA: Diagnosis not present

## 2017-04-14 DIAGNOSIS — M25561 Pain in right knee: Secondary | ICD-10-CM | POA: Diagnosis not present

## 2017-04-20 DIAGNOSIS — M9903 Segmental and somatic dysfunction of lumbar region: Secondary | ICD-10-CM | POA: Diagnosis not present

## 2017-04-20 DIAGNOSIS — M545 Low back pain: Secondary | ICD-10-CM | POA: Diagnosis not present

## 2017-04-20 DIAGNOSIS — M25562 Pain in left knee: Secondary | ICD-10-CM | POA: Diagnosis not present

## 2017-04-20 DIAGNOSIS — M25561 Pain in right knee: Secondary | ICD-10-CM | POA: Diagnosis not present

## 2017-05-02 MED FILL — SPIRONOLACTONE 25 MG TABLET: 25 | 90 days supply | Qty: 45 | Fill #2

## 2017-05-02 MED FILL — LISINOPRIL 40 MG TABLET: 40 | 90 days supply | Qty: 90 | Fill #2

## 2017-05-12 DIAGNOSIS — M9904 Segmental and somatic dysfunction of sacral region: Secondary | ICD-10-CM | POA: Diagnosis not present

## 2017-05-12 DIAGNOSIS — M25561 Pain in right knee: Secondary | ICD-10-CM | POA: Diagnosis not present

## 2017-05-12 DIAGNOSIS — M9903 Segmental and somatic dysfunction of lumbar region: Secondary | ICD-10-CM | POA: Diagnosis not present

## 2017-05-12 DIAGNOSIS — M545 Low back pain: Secondary | ICD-10-CM | POA: Diagnosis not present

## 2017-05-19 ENCOUNTER — Other Ambulatory Visit (HOSPITAL_COMMUNITY): Payer: Self-pay | Admitting: Family Medicine

## 2017-05-19 DIAGNOSIS — R1032 Left lower quadrant pain: Secondary | ICD-10-CM

## 2017-05-23 ENCOUNTER — Ambulatory Visit (HOSPITAL_COMMUNITY)
Admission: RE | Admit: 2017-05-23 | Discharge: 2017-05-23 | Disposition: A | Discharge status: Home / Self Care | Payer: 59 | Source: Ambulatory Visit | Attending: Family Medicine | Admitting: Family Medicine

## 2017-05-23 DIAGNOSIS — N200 Calculus of kidney: Secondary | ICD-10-CM | POA: Insufficient documentation

## 2017-05-23 DIAGNOSIS — K5730 Diverticulosis of large intestine without perforation or abscess without bleeding: Secondary | ICD-10-CM | POA: Diagnosis not present

## 2017-05-23 DIAGNOSIS — R1032 Left lower quadrant pain: Secondary | ICD-10-CM | POA: Insufficient documentation

## 2017-05-23 DIAGNOSIS — R109 Unspecified abdominal pain: Secondary | ICD-10-CM | POA: Diagnosis not present

## 2017-05-23 DIAGNOSIS — K760 Fatty (change of) liver, not elsewhere classified: Secondary | ICD-10-CM | POA: Insufficient documentation

## 2017-05-23 MED ORDER — IOPAMIDOL (ISOVUE-300) INJECTION 61%
INTRAVENOUS | Status: AC
  Filled 2017-05-23: qty 100

## 2017-05-23 MED ORDER — IOPAMIDOL (ISOVUE-300) INJECTION 61%
100.0000 mL | Freq: Once | INTRAVENOUS | Status: AC | PRN
  Administered 2017-05-23: 100 mL via INTRAVENOUS

## 2017-05-23 MED FILL — VENLAFAXINE HCL ER 75 MG CA: 75 | 90 days supply | Qty: 90 | Fill #1

## 2017-05-23 MED FILL — ATORVASTATIN 40 MG TABLET: 40 | 90 days supply | Qty: 90 | Fill #2

## 2017-05-23 MED FILL — METOPROLOL SUCC ER 100 MG T: 100 | 30 days supply | Qty: 45 | Fill #3

## 2017-06-10 DIAGNOSIS — N23 Unspecified renal colic: Secondary | ICD-10-CM | POA: Diagnosis not present

## 2017-06-13 DIAGNOSIS — D229 Melanocytic nevi, unspecified: Secondary | ICD-10-CM | POA: Diagnosis not present

## 2017-06-13 DIAGNOSIS — L821 Other seborrheic keratosis: Secondary | ICD-10-CM | POA: Diagnosis not present

## 2017-06-13 DIAGNOSIS — Z85828 Personal history of other malignant neoplasm of skin: Secondary | ICD-10-CM | POA: Diagnosis not present

## 2017-06-14 ENCOUNTER — Other Ambulatory Visit: Payer: Self-pay | Admitting: Urology

## 2017-07-04 MED FILL — PANTOPRAZOLE SOD DR 40 MG T: 40 | 90 days supply | Qty: 90 | Fill #3

## 2017-07-07 NOTE — Progress Notes (Signed)
ECHO 01-03-16 EPIC

## 2017-07-07 NOTE — Patient Instructions (Addendum)
Deborah Thompson  07/07/2017   Your procedure is scheduled on: 07-11-17  Report to [facility] Main  Entrance Take [place]  elevators to 3rd floor to  [place] at    1100  AM.    Call this number if you have problems the morning of surgery [phone]    Remember: ONLY 1 PERSON MAY GO WITH YOU TO SHORT STAY TO GET  READY MORNING OF YOUR SURGERY.  Do not eat food :After Midnight.CLEAR LIQUIDS FROM MIDNIGHT UNTIL 700 AM DAY OF SURGERY-NOTHING BY MOUTH AFTER 700 AM DAY OF SURGERY.     CLEAR LIQUID DIET   Foods Allowed                                                                     Foods Excluded  Coffee and tea, regular and decaf                             liquids that you cannot  Plain Jell-O in any flavor                                             see through such as: Fruit ices (not with fruit pulp)                                     milk, soups, orange juice  Iced Popsicles                                    All solid food Carbonated beverages, regular and diet                                    Cranberry, grape and apple juices Sports drinks like Gatorade Lightly seasoned clear broth or consume(fat free) Sugar, honey syrup  Sample Menu Breakfast                                Lunch                                     Supper Cranberry juice                    Beef broth                            Chicken broth Jell-O                                     Grape juice  Apple juice Coffee or tea                        Jell-O                                      Popsicle                                                Coffee or tea                        Coffee or tea  _____________________________________________________________________     Take these medicines the morning of surgery with A SIP OF WATER: EFFEXOR                                You may not have any metal on your body including hair pins and      piercings  Do not wear jewelry, make-up, lotions, powders or perfumes, deodorant             Do not wear nail polish.  Do not shave  48 hours prior to surgery.     Do not bring valuables to the hospital. [place].  Contacts, dentures or bridgework may not be worn into surgery.  Leave suitcase in the car. After surgery it may be brought to your room.                   Please read over the following fact sheets you were given: _____________________________________________________________________         [facility] - Preparing for Surgery Before surgery, you can play an important role.  Because skin is not sterile, your skin needs to be as free of germs as possible.  You can reduce the number of germs on your skin by washing with CHG (chlorahexidine gluconate) soap before surgery.  CHG is an antiseptic cleaner which kills germs and bonds with the skin to continue killing germs even after washing. Please DO NOT use if you have an allergy to CHG or antibacterial soaps.  If your skin becomes reddened/irritated stop using the CHG and inform your nurse when you arrive at Short Stay. Do not shave (including legs and underarms) for at least 48 hours prior to the first CHG shower.  You may shave your face/neck. Please follow these instructions carefully:  1.  Shower with CHG Soap the night before surgery and the  morning of Surgery.  2.  If you choose to wash your hair, wash your hair first as usual with your  normal  shampoo.  3.  After you shampoo, rinse your hair and body thoroughly to remove the  shampoo.                           4.  Use CHG as you would any other liquid soap.  You can apply chg directly  to the skin and wash  Gently with a scrungie or clean washcloth.  5.  Apply the CHG Soap to your body ONLY FROM THE NECK DOWN.   Do not use on face/ open                           Wound or open sores. Avoid contact with  eyes, ears mouth and genitals (private parts).                       Wash face,  Genitals (private parts) with your normal soap.             6.  Wash thoroughly, paying special attention to the area where your surgery  will be performed.  7.  Thoroughly rinse your body with warm water from the neck down.  8.  DO NOT shower/wash with your normal soap after using and rinsing off  the CHG Soap.                9.  Pat yourself dry with a clean towel.            10.  Wear clean pajamas.            11.  Place clean sheets on your bed the night of your first shower and do not  sleep with pets. Day of Surgery : Do not apply any lotions/deodorants the morning of surgery.  Please wear clean clothes to the hospital/surgery center.  FAILURE TO FOLLOW THESE INSTRUCTIONS MAY RESULT IN THE CANCELLATION OF YOUR SURGERY PATIENT SIGNATURE_________________________________  NURSE SIGNATURE__________________________________  ________________________________________________________________________    CLEAR LIQUID DIET   Foods Allowed                                                                     Foods Excluded  Coffee and tea, regular and decaf                             liquids that you cannot  Plain Jell-O in any flavor                                             see through such as: Fruit ices (not with fruit pulp)                                     milk, soups, orange juice  Iced Popsicles                                    All solid food Carbonated beverages, regular and diet                                    Cranberry, grape and apple juices Sports drinks like Gatorade Lightly seasoned clear broth or consume(fat free) Sugar, honey syrup  Sample Menu Breakfast                                Lunch                                     Supper Cranberry juice                    Beef broth                            Chicken broth Jell-O                                     Grape juice                            Apple juice Coffee or tea                        Jell-O                                      Popsicle                                                Coffee or tea                        Coffee or tea  _____________________________________________________________________

## 2017-07-08 ENCOUNTER — Encounter (HOSPITAL_COMMUNITY): Payer: Self-pay

## 2017-07-08 ENCOUNTER — Encounter (HOSPITAL_COMMUNITY)
Admission: RE | Admit: 2017-07-08 | Discharge: 2017-07-08 | Disposition: A | Discharge status: Home / Self Care | Payer: 59 | Source: Ambulatory Visit | Attending: Urology | Admitting: Urology

## 2017-07-08 ENCOUNTER — Other Ambulatory Visit: Payer: Self-pay

## 2017-07-08 DIAGNOSIS — Z87442 Personal history of urinary calculi: Secondary | ICD-10-CM | POA: Diagnosis not present

## 2017-07-08 DIAGNOSIS — Z7982 Long term (current) use of aspirin: Secondary | ICD-10-CM | POA: Diagnosis not present

## 2017-07-08 DIAGNOSIS — K219 Gastro-esophageal reflux disease without esophagitis: Secondary | ICD-10-CM | POA: Diagnosis not present

## 2017-07-08 DIAGNOSIS — Z8673 Personal history of transient ischemic attack (TIA), and cerebral infarction without residual deficits: Secondary | ICD-10-CM | POA: Diagnosis not present

## 2017-07-08 DIAGNOSIS — F329 Major depressive disorder, single episode, unspecified: Secondary | ICD-10-CM | POA: Diagnosis not present

## 2017-07-08 DIAGNOSIS — E7800 Pure hypercholesterolemia, unspecified: Secondary | ICD-10-CM | POA: Diagnosis not present

## 2017-07-08 DIAGNOSIS — I10 Essential (primary) hypertension: Secondary | ICD-10-CM | POA: Diagnosis not present

## 2017-07-08 DIAGNOSIS — Z87891 Personal history of nicotine dependence: Secondary | ICD-10-CM | POA: Diagnosis not present

## 2017-07-08 DIAGNOSIS — N200 Calculus of kidney: Secondary | ICD-10-CM | POA: Diagnosis not present

## 2017-07-08 DIAGNOSIS — Z79899 Other long term (current) drug therapy: Secondary | ICD-10-CM | POA: Diagnosis not present

## 2017-07-08 LAB — CBC
HEMATOCRIT: 37.5 % (ref 36.0–46.0)
Hemoglobin: 12.5 g/dL (ref 12.0–15.0)
MCH: 29.8 pg (ref 26.0–34.0)
MCHC: 33.3 g/dL (ref 30.0–36.0)
MCV: 89.5 fL (ref 78.0–100.0)
Platelets: 249 10*3/uL (ref 150–400)
RBC: 4.19 MIL/uL (ref 3.87–5.11)
RDW: 12.9 % (ref 11.5–15.5)
WBC: 4.4 10*3/uL (ref 4.0–10.5)

## 2017-07-08 LAB — BASIC METABOLIC PANEL
Anion gap: 6 (ref 5–15)
BUN: 14 mg/dL (ref 6–20)
CHLORIDE: 107 mmol/L (ref 101–111)
CO2: 29 mmol/L (ref 22–32)
Calcium: 9.8 mg/dL (ref 8.9–10.3)
Creatinine, Ser: 0.78 mg/dL (ref 0.44–1.00)
GFR calc Af Amer: >60 mL/min (ref >60)
GFR calc non Af Amer: >60 mL/min (ref >60)
GLUCOSE: 111 mg/dL — AB (ref 65–99)
POTASSIUM: 4.4 mmol/L (ref 3.5–5.1)
Sodium: 142 mmol/L (ref 135–145)

## 2017-07-08 LAB — HCG, SERUM, QUALITATIVE: Preg, Serum: NEGATIVE

## 2017-07-11 ENCOUNTER — Observation Stay (HOSPITAL_COMMUNITY)
Admission: RE | Admit: 2017-07-11 | Discharge: 2017-07-12 | Disposition: A | Discharge status: Home / Self Care | Payer: 59 | Source: Ambulatory Visit | Attending: Urology | Admitting: Urology

## 2017-07-11 ENCOUNTER — Other Ambulatory Visit: Payer: Self-pay

## 2017-07-11 ENCOUNTER — Encounter (HOSPITAL_COMMUNITY): Payer: Self-pay | Admitting: *Deleted

## 2017-07-11 ENCOUNTER — Ambulatory Visit (HOSPITAL_COMMUNITY): Payer: 59 | Admitting: Anesthesiology

## 2017-07-11 ENCOUNTER — Encounter (HOSPITAL_COMMUNITY)
Admission: RE | Disposition: A | Discharge status: Home / Self Care | Payer: Self-pay | Source: Ambulatory Visit | Attending: Urology

## 2017-07-11 ENCOUNTER — Observation Stay (HOSPITAL_COMMUNITY): Payer: 59

## 2017-07-11 DIAGNOSIS — Z79899 Other long term (current) drug therapy: Secondary | ICD-10-CM | POA: Insufficient documentation

## 2017-07-11 DIAGNOSIS — I10 Essential (primary) hypertension: Secondary | ICD-10-CM | POA: Diagnosis not present

## 2017-07-11 DIAGNOSIS — F329 Major depressive disorder, single episode, unspecified: Secondary | ICD-10-CM | POA: Diagnosis not present

## 2017-07-11 DIAGNOSIS — Z87442 Personal history of urinary calculi: Secondary | ICD-10-CM | POA: Diagnosis not present

## 2017-07-11 DIAGNOSIS — E7800 Pure hypercholesterolemia, unspecified: Secondary | ICD-10-CM | POA: Insufficient documentation

## 2017-07-11 DIAGNOSIS — Z7982 Long term (current) use of aspirin: Secondary | ICD-10-CM | POA: Insufficient documentation

## 2017-07-11 DIAGNOSIS — Z8673 Personal history of transient ischemic attack (TIA), and cerebral infarction without residual deficits: Secondary | ICD-10-CM | POA: Insufficient documentation

## 2017-07-11 DIAGNOSIS — Z87891 Personal history of nicotine dependence: Secondary | ICD-10-CM | POA: Insufficient documentation

## 2017-07-11 DIAGNOSIS — K219 Gastro-esophageal reflux disease without esophagitis: Secondary | ICD-10-CM | POA: Diagnosis not present

## 2017-07-11 DIAGNOSIS — N200 Calculus of kidney: Principal | ICD-10-CM

## 2017-07-11 DIAGNOSIS — I671 Cerebral aneurysm, nonruptured: Secondary | ICD-10-CM | POA: Diagnosis not present

## 2017-07-11 HISTORY — PX: CYSTOSCOPY WITH STENT PLACEMENT: SHX5790

## 2017-07-11 HISTORY — PX: HOLMIUM LASER APPLICATION: SHX5852

## 2017-07-11 HISTORY — PX: NEPHROLITHOTOMY: SHX5134

## 2017-07-11 LAB — BASIC METABOLIC PANEL
ANION GAP: 9 (ref 5–15)
BUN: 14 mg/dL (ref 6–20)
CHLORIDE: 104 mmol/L (ref 101–111)
CO2: 26 mmol/L (ref 22–32)
Calcium: 8.9 mg/dL (ref 8.9–10.3)
Creatinine, Ser: 0.84 mg/dL (ref 0.44–1.00)
GFR calc non Af Amer: >60 mL/min (ref >60)
Glucose, Bld: 154 mg/dL — ABNORMAL HIGH (ref 65–99)
POTASSIUM: 3.6 mmol/L (ref 3.5–5.1)
SODIUM: 139 mmol/L (ref 135–145)

## 2017-07-11 LAB — CBC
HEMATOCRIT: 36 % (ref 36.0–46.0)
HEMOGLOBIN: 12.1 g/dL (ref 12.0–15.0)
MCH: 29.8 pg (ref 26.0–34.0)
MCHC: 33.6 g/dL (ref 30.0–36.0)
MCV: 88.7 fL (ref 78.0–100.0)
PLATELETS: 196 10*3/uL (ref 150–400)
RBC: 4.06 MIL/uL (ref 3.87–5.11)
RDW: 12.9 % (ref 11.5–15.5)
WBC: 6.9 10*3/uL (ref 4.0–10.5)

## 2017-07-11 SURGERY — NEPHROLITHOTOMY PERCUTANEOUS
Anesthesia: General | Site: Ureter | Laterality: Left

## 2017-07-11 MED ORDER — OXYCODONE HCL 5 MG PO TABS: 5.0000 mg | ORAL_TABLET | Freq: Once | ORAL | Status: DC | PRN

## 2017-07-11 MED ORDER — FENTANYL CITRATE (PF) 250 MCG/5ML IJ SOLN
INTRAMUSCULAR | Status: AC
  Filled 2017-07-11: qty 5

## 2017-07-11 MED ORDER — PROPOFOL 10 MG/ML IV BOLUS
INTRAVENOUS | Status: DC | PRN
  Administered 2017-07-11: 50 mg via INTRAVENOUS
  Administered 2017-07-11: 20 mg via INTRAVENOUS
  Administered 2017-07-11: 160 mg via INTRAVENOUS
  Administered 2017-07-11: 40 mg via INTRAVENOUS

## 2017-07-11 MED ORDER — ZOLPIDEM TARTRATE 5 MG PO TABS: 5.0000 mg | ORAL_TABLET | Freq: Every evening | ORAL | Status: DC | PRN

## 2017-07-11 MED ORDER — CEFAZOLIN SODIUM-DEXTROSE 2-4 GM/100ML-% IV SOLN
2.0000 g | Freq: Three times a day (TID) | INTRAVENOUS | Status: AC
  Administered 2017-07-11 – 2017-07-12 (×2): 2 g via INTRAVENOUS
  Filled 2017-07-11 (×2): qty 100

## 2017-07-11 MED ORDER — MIDAZOLAM HCL 2 MG/2ML IJ SOLN
INTRAMUSCULAR | Status: AC
  Filled 2017-07-11: qty 2

## 2017-07-11 MED ORDER — MIDAZOLAM HCL 5 MG/5ML IJ SOLN
INTRAMUSCULAR | Status: DC | PRN
  Administered 2017-07-11: 2 mg via INTRAVENOUS

## 2017-07-11 MED ORDER — ACETAMINOPHEN 325 MG PO TABS: 650.0000 mg | ORAL_TABLET | ORAL | Status: DC | PRN

## 2017-07-11 MED ORDER — DIPHENHYDRAMINE HCL 50 MG/ML IJ SOLN: 12.5000 mg | Freq: Four times a day (QID) | INTRAMUSCULAR | Status: DC | PRN

## 2017-07-11 MED ORDER — PROMETHAZINE HCL 25 MG/ML IJ SOLN: 6.2500 mg | INTRAMUSCULAR | Status: DC | PRN

## 2017-07-11 MED ORDER — METOPROLOL SUCCINATE ER 50 MG PO TB24
50.0000 mg | ORAL_TABLET | Freq: Every day | ORAL | Status: DC
Start: 2017-07-11 — End: 2017-07-12
  Administered 2017-07-11: 50 mg via ORAL
  Filled 2017-07-11: qty 1

## 2017-07-11 MED ORDER — SUGAMMADEX SODIUM 200 MG/2ML IV SOLN
INTRAVENOUS | Status: DC | PRN
  Administered 2017-07-11: 175 mg via INTRAVENOUS

## 2017-07-11 MED ORDER — HYDROMORPHONE HCL 1 MG/ML IJ SOLN
INTRAMUSCULAR | Status: AC
  Filled 2017-07-11: qty 1

## 2017-07-11 MED ORDER — EPHEDRINE SULFATE-NACL 50-0.9 MG/10ML-% IV SOSY
PREFILLED_SYRINGE | INTRAVENOUS | Status: DC | PRN
  Administered 2017-07-11 (×2): 10 mg via INTRAVENOUS

## 2017-07-11 MED ORDER — ROCURONIUM BROMIDE 50 MG/5ML IV SOSY
PREFILLED_SYRINGE | INTRAVENOUS | Status: AC
  Filled 2017-07-11: qty 5

## 2017-07-11 MED ORDER — ONDANSETRON HCL 4 MG/2ML IJ SOLN
INTRAMUSCULAR | Status: AC
  Filled 2017-07-11: qty 2

## 2017-07-11 MED ORDER — DEXAMETHASONE SODIUM PHOSPHATE 10 MG/ML IJ SOLN
INTRAMUSCULAR | Status: AC
  Filled 2017-07-11: qty 1

## 2017-07-11 MED ORDER — OXYCODONE-ACETAMINOPHEN 5-325 MG PO TABS: 1.0000 | ORAL_TABLET | ORAL | Status: DC | PRN

## 2017-07-11 MED ORDER — SPIRONOLACTONE 12.5 MG HALF TABLET
12.5000 mg | ORAL_TABLET | Freq: Every day | ORAL | Status: DC
  Administered 2017-07-12: 12.5 mg via ORAL
  Filled 2017-07-11: qty 1

## 2017-07-11 MED ORDER — HYDROMORPHONE HCL 1 MG/ML IJ SOLN
0.2500 mg | INTRAMUSCULAR | Status: DC | PRN
  Administered 2017-07-11 (×2): 0.5 mg via INTRAVENOUS

## 2017-07-11 MED ORDER — FENTANYL CITRATE (PF) 100 MCG/2ML IJ SOLN
INTRAMUSCULAR | Status: DC | PRN
  Administered 2017-07-11: 100 ug via INTRAVENOUS
  Administered 2017-07-11 (×2): 50 ug via INTRAVENOUS

## 2017-07-11 MED ORDER — DOCUSATE SODIUM 100 MG PO CAPS
100.0000 mg | ORAL_CAPSULE | Freq: Two times a day (BID) | ORAL | Status: DC
  Administered 2017-07-11 – 2017-07-12 (×2): 100 mg via ORAL
  Filled 2017-07-11 (×2): qty 1

## 2017-07-11 MED ORDER — OXYCODONE HCL 5 MG/5ML PO SOLN
5.0000 mg | Freq: Once | ORAL | Status: DC | PRN
  Filled 2017-07-11: qty 5

## 2017-07-11 MED ORDER — HYDROMORPHONE HCL 1 MG/ML IJ SOLN
0.5000 mg | INTRAMUSCULAR | Status: DC | PRN
  Administered 2017-07-11: 1 mg via INTRAVENOUS
  Filled 2017-07-11: qty 1

## 2017-07-11 MED ORDER — VENLAFAXINE HCL ER 75 MG PO CP24
75.0000 mg | ORAL_CAPSULE | Freq: Every day | ORAL | Status: DC
  Administered 2017-07-12: 75 mg via ORAL
  Filled 2017-07-11: qty 1

## 2017-07-11 MED ORDER — ASPIRIN EC 81 MG PO TBEC
81.0000 mg | DELAYED_RELEASE_TABLET | Freq: Every day | ORAL | Status: DC
  Administered 2017-07-11 – 2017-07-12 (×2): 81 mg via ORAL
  Filled 2017-07-11 (×2): qty 1

## 2017-07-11 MED ORDER — CEFAZOLIN SODIUM-DEXTROSE 2-4 GM/100ML-% IV SOLN
2.0000 g | INTRAVENOUS | Status: AC
  Administered 2017-07-11: 2 g via INTRAVENOUS
  Filled 2017-07-11: qty 100

## 2017-07-11 MED ORDER — PANTOPRAZOLE SODIUM 40 MG PO TBEC
40.0000 mg | DELAYED_RELEASE_TABLET | Freq: Every day | ORAL | Status: DC
  Administered 2017-07-11: 40 mg via ORAL
  Filled 2017-07-11: qty 1

## 2017-07-11 MED ORDER — SODIUM CHLORIDE 0.9 % IR SOLN
Status: DC | PRN
Start: 2017-07-11 — End: 2017-07-11
  Administered 2017-07-11: 8000 mL

## 2017-07-11 MED ORDER — LIDOCAINE 2% (20 MG/ML) 5 ML SYRINGE
INTRAMUSCULAR | Status: DC | PRN
  Administered 2017-07-11: 100 mg via INTRAVENOUS

## 2017-07-11 MED ORDER — STERILE WATER FOR IRRIGATION IR SOLN
Status: DC | PRN
  Administered 2017-07-11: 1000 mL

## 2017-07-11 MED ORDER — ATORVASTATIN CALCIUM 40 MG PO TABS
40.0000 mg | ORAL_TABLET | Freq: Every day | ORAL | Status: DC
  Administered 2017-07-11: 40 mg via ORAL
  Filled 2017-07-11: qty 1

## 2017-07-11 MED ORDER — LIDOCAINE HCL 2 % EX GEL
CUTANEOUS | Status: AC
  Filled 2017-07-11: qty 5

## 2017-07-11 MED ORDER — PROPOFOL 10 MG/ML IV BOLUS
INTRAVENOUS | Status: AC
  Filled 2017-07-11: qty 20

## 2017-07-11 MED ORDER — ONDANSETRON HCL 4 MG/2ML IJ SOLN
4.0000 mg | INTRAMUSCULAR | Status: DC | PRN
  Administered 2017-07-11: 4 mg via INTRAVENOUS
  Filled 2017-07-11: qty 2

## 2017-07-11 MED ORDER — LACTATED RINGERS IV SOLN
INTRAVENOUS | Status: DC
  Administered 2017-07-11 (×2): via INTRAVENOUS

## 2017-07-11 MED ORDER — EPHEDRINE 5 MG/ML INJ
INTRAVENOUS | Status: AC
  Filled 2017-07-11: qty 10

## 2017-07-11 MED ORDER — SUGAMMADEX SODIUM 200 MG/2ML IV SOLN
INTRAVENOUS | Status: AC
Start: 2017-07-11 — End: 2017-07-11
  Filled 2017-07-11: qty 2

## 2017-07-11 MED ORDER — ONDANSETRON HCL 4 MG/2ML IJ SOLN
INTRAMUSCULAR | Status: DC | PRN
  Administered 2017-07-11: 4 mg via INTRAVENOUS

## 2017-07-11 MED ORDER — IOHEXOL 300 MG/ML  SOLN
INTRAMUSCULAR | Status: DC | PRN
  Administered 2017-07-11: 40 mL

## 2017-07-11 MED ORDER — DEXAMETHASONE SODIUM PHOSPHATE 10 MG/ML IJ SOLN
INTRAMUSCULAR | Status: DC | PRN
  Administered 2017-07-11: 10 mg via INTRAVENOUS

## 2017-07-11 MED ORDER — SODIUM CHLORIDE 0.9 % IV SOLN
INTRAVENOUS | Status: DC
  Administered 2017-07-11 (×2): via INTRAVENOUS

## 2017-07-11 MED ORDER — DIPHENHYDRAMINE HCL 12.5 MG/5ML PO ELIX: 12.5000 mg | ORAL_SOLUTION | Freq: Four times a day (QID) | ORAL | Status: DC | PRN

## 2017-07-11 MED ORDER — HYOSCYAMINE SULFATE 0.125 MG SL SUBL
0.1250 mg | SUBLINGUAL_TABLET | SUBLINGUAL | Status: DC | PRN
  Filled 2017-07-11: qty 1

## 2017-07-11 MED ORDER — IOPAMIDOL (ISOVUE-300) INJECTION 61%: INTRAVENOUS | Status: DC | PRN

## 2017-07-11 MED ORDER — ROCURONIUM BROMIDE 10 MG/ML (PF) SYRINGE
PREFILLED_SYRINGE | INTRAVENOUS | Status: DC | PRN
  Administered 2017-07-11: 40 mg via INTRAVENOUS

## 2017-07-11 MED ORDER — LIDOCAINE 2% (20 MG/ML) 5 ML SYRINGE
INTRAMUSCULAR | Status: AC
  Filled 2017-07-11: qty 5

## 2017-07-11 SURGICAL SUPPLY — 69 items
APL SKNCLS STERI-STRIP NONHPOA (GAUZE/BANDAGES/DRESSINGS) ×6
BAG URINE DRAINAGE (UROLOGICAL SUPPLIES) ×10 IMPLANT
BAG URINE LEG 500ML (DRAIN) ×5 IMPLANT
BAG URO CATCHER STRL LF (MISCELLANEOUS) ×5 IMPLANT
BASKET STONE NITINOL 3FRX115MB (UROLOGICAL SUPPLIES) ×5 IMPLANT
BASKET ZERO TIP NITINOL 2.4FR (BASKET) IMPLANT
BENZOIN TINCTURE PRP APPL 2/3 (GAUZE/BANDAGES/DRESSINGS) ×10 IMPLANT
BLADE SURG 15 STRL LF DISP TIS (BLADE) ×3 IMPLANT
BLADE SURG 15 STRL SS (BLADE) ×5
BSKT STON RTRVL ZERO TP 2.4FR (BASKET)
CATH FOLEY 2W COUNCIL 20FR 5CC (CATHETERS) IMPLANT
CATH FOLEY 2WAY SLVR  5CC 16FR (CATHETERS) ×2
CATH FOLEY 2WAY SLVR  5CC 18FR (CATHETERS) ×2
CATH FOLEY 2WAY SLVR 5CC 16FR (CATHETERS) ×3 IMPLANT
CATH FOLEY 2WAY SLVR 5CC 18FR (CATHETERS) ×3 IMPLANT
CATH IMAGER II 65CM (CATHETERS) ×10 IMPLANT
CATH INTERMIT  6FR 70CM (CATHETERS) ×10 IMPLANT
CATH ROBINSON RED A/P 20FR (CATHETERS) IMPLANT
CATH URET DUAL LUMEN 6-10FR 50 (CATHETERS) IMPLANT
CATH X-FORCE N30 NEPHROSTOMY (TUBING) ×5 IMPLANT
CLOTH BEACON ORANGE TIMEOUT ST (SAFETY) ×5 IMPLANT
COVER FOOTSWITCH UNIV (MISCELLANEOUS) ×5 IMPLANT
COVER SURGICAL LIGHT HANDLE (MISCELLANEOUS) ×5 IMPLANT
DEVICE INFLATION 20CC 30ATM (MISCELLANEOUS) ×5 IMPLANT
DRAPE C-ARM 42X120 X-RAY (DRAPES) ×5 IMPLANT
DRAPE LINGEMAN PERC (DRAPES) ×5 IMPLANT
DRAPE SHEET LG 3/4 BI-LAMINATE (DRAPES) ×5 IMPLANT
DRAPE SURG IRRIG POUCH 19X23 (DRAPES) ×5 IMPLANT
DRSG PAD ABDOMINAL 8X10 ST (GAUZE/BANDAGES/DRESSINGS) ×10 IMPLANT
DRSG TEGADERM 8X12 (GAUZE/BANDAGES/DRESSINGS) ×5 IMPLANT
FIBER LASER FLEXIVA 1000 (UROLOGICAL SUPPLIES) IMPLANT
FIBER LASER FLEXIVA 365 (UROLOGICAL SUPPLIES) IMPLANT
FIBER LASER FLEXIVA 550 (UROLOGICAL SUPPLIES) IMPLANT
FIBER LASER TRAC TIP (UROLOGICAL SUPPLIES) IMPLANT
GAUZE SPONGE 4X4 12PLY STRL (GAUZE/BANDAGES/DRESSINGS) ×5 IMPLANT
GLOVE BIO SURGEON STRL SZ8 (GLOVE) ×15 IMPLANT
GLOVE BIOGEL M 8.0 STRL (GLOVE) ×5 IMPLANT
GLOVE BIOGEL PI IND STRL 8 (GLOVE) IMPLANT
GLOVE BIOGEL PI INDICATOR 8 (GLOVE)
GOWN STRL REUS W/TWL LRG LVL3 (GOWN DISPOSABLE) ×10 IMPLANT
GOWN STRL REUS W/TWL XL LVL3 (GOWN DISPOSABLE) ×5 IMPLANT
GUIDEWIRE STR DUAL SENSOR (WIRE) ×10 IMPLANT
HOLDER NEEDLE AMPLATZ W/INSERT (MISCELLANEOUS) ×5 IMPLANT
IV SET EXTENSION CATH 6 NF (IV SETS) ×5 IMPLANT
KIT BASIN OR (CUSTOM PROCEDURE TRAY) ×5 IMPLANT
MANIFOLD NEPTUNE II (INSTRUMENTS) ×5 IMPLANT
NEEDLE TROCAR 18X15 ECHO (NEEDLE) ×5 IMPLANT
NEEDLE TROCAR 18X20 (NEEDLE) IMPLANT
NS IRRIG 1000ML POUR BTL (IV SOLUTION) ×5 IMPLANT
PACK CYSTO (CUSTOM PROCEDURE TRAY) ×5 IMPLANT
PROBE LITHOCLAST ULTRA 3.8X403 (UROLOGICAL SUPPLIES) IMPLANT
PROBE PNEUMATIC 1.0MMX570MM (UROLOGICAL SUPPLIES) IMPLANT
SET AMPLATZ RENAL DILATOR (MISCELLANEOUS) IMPLANT
SET IRRIG Y TYPE TUR BLADDER L (SET/KITS/TRAYS/PACK) ×5 IMPLANT
SHEATH PEELAWAY SET 9 (SHEATH) ×5 IMPLANT
SPONGE LAP 4X18 X RAY DECT (DISPOSABLE) ×5 IMPLANT
STENT CONTOUR 6FRX26X.038 (STENTS) IMPLANT
STENT URET 6FRX26 CONTOUR (STENTS) ×5 IMPLANT
STONE CATCHER W/TUBE ADAPTER (UROLOGICAL SUPPLIES) IMPLANT
SUT SILK 2 0 30  PSL (SUTURE) ×4
SUT SILK 2 0 30 PSL (SUTURE) ×6 IMPLANT
SYR 10ML LL (SYRINGE) ×5 IMPLANT
SYR 20CC LL (SYRINGE) ×10 IMPLANT
SYR 50ML LL SCALE MARK (SYRINGE) ×5 IMPLANT
SYR CONTROL 10ML LL (SYRINGE) ×5 IMPLANT
TOWEL OR 17X26 10 PK STRL BLUE (TOWEL DISPOSABLE) ×5 IMPLANT
TUBING CONNECTING 10 (TUBING) ×8 IMPLANT
TUBING CONNECTING 10' (TUBING) ×2
WATER STERILE IRR 1000ML POUR (IV SOLUTION) ×5 IMPLANT

## 2017-07-11 NOTE — Transfer of Care (Signed)
Immediate Anesthesia Transfer of Care Note  Patient: Deborah Thompson  Procedure(s) Performed: NEPHROLITHOTOMY PERCUTANEOUS (Left Renal) CYSTOSCOPY WITH STENT PLACEMENT (Left Ureter) HOLMIUM LASER APPLICATION (Left )  Patient Location: PACU  Anesthesia Type:General  Level of Consciousness: awake, alert  and oriented  Airway & Oxygen Therapy: Patient Spontanous Breathing and Patient connected to face mask oxygen  Post-op Assessment: Report given to RN and Post -op Vital signs reviewed and stable  Post vital signs: Reviewed and stable  Last Vitals:  Vitals:   07/11/17 1049  BP: 128/80  Pulse: 64  Resp: 18  Temp: 36.9 C  SpO2: 100%    Last Pain:  Vitals:   07/11/17 1113  TempSrc:   PainSc: 0-No pain      Patients Stated Pain Goal: 4 (19/59/74 7185)  Complications: No apparent anesthesia complications

## 2017-07-11 NOTE — Op Note (Signed)
Marland KitchenPreoperative diagnosis: Left renal stone  Postoperative diagnosis: Same  Procedure 1.  Left percutaneous nephrostolithotomy for stone greater than 2 cm 2.  Left nephrostogram 3.  Intraoperative fluoroscopy, under 1 hour, with interpretation 4.  Placement of a 6 x 26 double-J ureteral stent. 5.  Percutaneous access into the left renal collecting system 6.  Placement of a 61 French nephrostomy tube 7.  Dilation of percutaneous tract  Attending: Dr. Alyson Ingles  Anesthesia: General  Estimated blood loss: 50cc  Antibiotics: ancef  Drains: 1.  53 French Foley catheter 2.  6 x 26 left double-J ureteral stent 3.  18 French nephrostomy tube  Specimens: Stone for analysis  Findings: partial staghorn calculus. Lower pole access  Indications: Patient is a 51 year old female with a history of large left renal stone.  After discussing treatment options and they decided to proceed with left percutaneous nephrostolithotomy.    Procedure in detail: Prior to procedure consent was obtained.  Patient was brought to the operating room and a timeout was done to ensure correct patient, correct procedure, and correct site.  General anesthesia was administered. The patients genetalia was prepped and draped. A flexible cystoscope was passed into the urethra and then the bladder. No masses or lesions were seen in the bladder. Ureteral orifices were in the normal anatomic location. A sensor wire was passed into the left ureter and up to the renal pelvis. A 6 french ureteral catheter was advanced over the wire and up to the renal pelvis the wire was then removed and so was the cystoscope.  A 16 French Foley catheter was in place and the ureteral catheter was secured with a 0 silk tie.  The patient was then placed in the prone position.   The left flank was then prepped and draped in usual sterile fashion.  Contrast was instilled throught the ureteral catheter and the bullseye technique was used to gain access into  the lower pole. Once we gained access a sensor wire was advanced through the spinal needle and down the ureter to the bladder. We then used an 8 french and 10 french dilater to dilate the tract. We then used a sheath to place a second wire down to the bladder.  We then made an incision at the level of the skin and over the wire we then placed a NephroMax dilator.  We dilated the nephrostomy tract to 30 [place] and held this 18 cm of water for 1 minute.  We then  placed the access sheath over the balloon.  The balloon was then deflated.  We then used a rigid nephroscope to perform nephroscopy.  We encountered a large lower pole stone. Using the LithoClast the stone was fragmented and the fragments were removed with graspers.  The stone fragments were sent for composition analysis.  We then removed the nephroscope and over the wire placed a 6 x 26 double-J ureteral stent.  The wire was then removed and good coil was noted in the renal pelvis under direct vision in the bladder under fluoroscopy.  We then placed a 18 French nephrostomy tube through the sheath into the renal pelvis.  The balloon was inflated with 3 mls of contrast.  We then removed the access sheath in and obtained another nephrostogram.  We noted minimal extravasation of contrast.  We then secured the nephrostomy tube with 0 silks in interrupted fashion.  Dressing was placed over the nephrostomy tube site and this then concluded the procedure was well-tolerated by the patient.  Complications: None  Condition: Stable, extubated, transferred to PACU  Plan: Patient is to be admitted overnight for observation.  Foley catheter through the morning.  Pt is then to be discharged home and followup in 1 week for nephrostomy tube removal. Stent to be removed 1 week after nephrostomy tube

## 2017-07-11 NOTE — H&P (Signed)
Urology Admission H&P  Chief Complaint: left flank pain  History of Present Illness: Ms Philley is a 51yo with a hx of left UPJ obstruction who was noted to have a 2.5cm calculus on CT  Past Medical History:  Diagnosis Date  . Cardiomyopathy ([place]) 18 yrs ago, postpartum, resolved soon after childbirth   after son was born  . Depression   . GERD (gastroesophageal reflux disease)   . History of gestational diabetes   . History of kidney stones   . Hypercholesterolemia   . Hypertension   . Migraines   . Preeclampsia   . Skin cancer    removed from left shin  . Stroke [facility]) 2017   reports that she had difficulty in making words, resolved now   Past Surgical History:  Procedure Laterality Date  . IR GENERIC HISTORICAL  03/30/2016   IR RADIOLOGIST EVAL & MGMT 03/30/2016 [facility]  . IR GENERIC HISTORICAL  04/02/2016   IR ANGIO INTRA EXTRACRAN SEL COM CAROTID INNOMINATE BILAT MOD SED 04/02/2016 Luanne Bras, MD [facility]  . IR GENERIC HISTORICAL  04/02/2016   IR ANGIO VERTEBRAL SEL VERTEBRAL BILAT MOD SED 04/02/2016 Luanne Bras, MD [facility]  . IR GENERIC HISTORICAL  04/13/2016   IR RADIOLOGIST EVAL & MGMT 04/13/2016 [facility]  . IR GENERIC HISTORICAL  05/05/2016   IR ANGIOGRAM FOLLOW UP STUDY 05/05/2016 Luanne Bras, MD [facility]  . IR GENERIC HISTORICAL  05/05/2016   IR ANGIO INTRA EXTRACRAN SEL INTERNAL CAROTID UNI R MOD SED 05/05/2016 Luanne Bras, MD [facility]  . IR GENERIC HISTORICAL  05/05/2016   IR TRANSCATH/EMBOLIZ 05/05/2016 Luanne Bras, MD [facility]  . IR GENERIC HISTORICAL  05/20/2016   IR RADIOLOGIST EVAL & MGMT 05/20/2016 [facility]  . NEPHROLITHOTOMY  04-1999   during pregnancy, left kidney  . RADIOLOGY WITH ANESTHESIA N/A 04/26/2016   Procedure: EMBOLIZATION;  Surgeon: Medication Radiologist, MD;  Location: [place];  Service: Radiology;  Laterality: N/A;  . RADIOLOGY WITH ANESTHESIA N/A 05/05/2016   Procedure: EMBOLIZATION;   Surgeon: Luanne Bras, MD;  Location: [place];  Service: Radiology;  Laterality: N/A;  . skin cancer removal    . WISDOM TOOTH EXTRACTION  1984    Home Medications:  Current Facility-Administered Medications  Medication Dose Route Frequency Provider Last Rate Last Dose  . ceFAZolin (ANCEF) IVPB 2g/100 mL premix  2 g Intravenous 30 min Pre-Op Lanitra Battaglini, Candee Furbish, MD      . lactated ringers infusion   Intravenous Continuous Lynda Rainwater, MD 75 mL/hr at 07/11/17 1127     Allergies: No Known Allergies  Family History  Problem Relation Age of Onset  . Heart disease Mother   . Heart disease Father   . Colon cancer Neg Hx    Social History:  reports that she has quit smoking. Her smoking use included cigarettes. She has a 4.00 pack-year smoking history. she has never used smokeless tobacco. She reports that she drinks alcohol. She reports that she does not use drugs.  Review of Systems  All other systems reviewed and are negative.   Physical Exam:  Vital signs in last 24 hours: Temp:  [98.4 F (36.9 C)] 98.4 F (36.9 C) (12/10 1049) Pulse Rate:  [64] 64 (12/10 1049) Resp:  [18] 18 (12/10 1049) BP: (128)/(80) 128/80 (12/10 1049) SpO2:  [100 %] 100 % (12/10 1049) Weight:  [79.8 kg (176 lb)] 79.8 kg (176 lb) (12/10 1113) Physical Exam  Constitutional: She is oriented to  person, place, and time. She appears well-developed and well-nourished.  HENT:  Head: Normocephalic and atraumatic.  Eyes: EOM are normal. Pupils are equal, round, and reactive to light.  Neck: Normal range of motion. No thyromegaly present.  Cardiovascular: Normal rate and regular rhythm.  Respiratory: Effort normal. No respiratory distress.  GI: Soft. She exhibits no distension.  Musculoskeletal: Normal range of motion. She exhibits no edema.  Neurological: She is alert and oriented to person, place, and time.  Skin: Skin is warm and dry.  Psychiatric: She has a normal mood and affect. Her behavior is  normal. Judgment and thought content normal.    Laboratory Data:  No results found for this or any previous visit (from the past 24 hour(s)). No results found for this or any previous visit (from the past 240 hour(s)). Creatinine: Recent Labs    07/08/17 0934  CREATININE 0.78   Baseline Creatinine: 0.8  Impression/Assessment:  51yo with large left renal calculus  Plan:  The risks/benefits/alternatives to L PCNL was explained to the patient and she understands and wishes to proceed with surgery  Nicolette Bang 07/11/2017, 12:46 PM

## 2017-07-11 NOTE — H&P (View-Only) (Signed)
Urology Admission H&P  Chief Complaint: left flank pain  History of Present Illness: Ms Deborah Thompson is a 51yo with a hx of left UPJ obstruction who was noted to have a 2.5cm calculus on CT  Past Medical History:  Diagnosis Date  . Cardiomyopathy (Deborah Thompson) 18 yrs ago, postpartum, resolved soon after childbirth   after son was born  . Depression   . GERD (gastroesophageal reflux disease)   . History of gestational diabetes   . History of kidney stones   . Hypercholesterolemia   . Hypertension   . Migraines   . Preeclampsia   . Skin cancer    removed from left shin  . Stroke Deborah Thompson) 2017   reports that she had difficulty in making words, resolved now   Past Surgical History:  Procedure Laterality Date  . IR GENERIC HISTORICAL  03/30/2016   IR RADIOLOGIST EVAL & MGMT 03/30/2016 [facility]  . IR GENERIC HISTORICAL  04/02/2016   IR ANGIO INTRA EXTRACRAN SEL COM CAROTID INNOMINATE BILAT MOD SED 04/02/2016 Deborah Bras, MD [facility]  . IR GENERIC HISTORICAL  04/02/2016   IR ANGIO VERTEBRAL SEL VERTEBRAL BILAT MOD SED 04/02/2016 Deborah Bras, MD [facility]  . IR GENERIC HISTORICAL  04/13/2016   IR RADIOLOGIST EVAL & MGMT 04/13/2016 [facility]  . IR GENERIC HISTORICAL  05/05/2016   IR ANGIOGRAM FOLLOW UP STUDY 05/05/2016 Deborah Bras, MD [facility]  . IR GENERIC HISTORICAL  05/05/2016   IR ANGIO INTRA EXTRACRAN SEL INTERNAL CAROTID UNI R MOD SED 05/05/2016 Deborah Bras, MD [facility]  . IR GENERIC HISTORICAL  05/05/2016   IR TRANSCATH/EMBOLIZ 05/05/2016 Deborah Bras, MD [facility]  . IR GENERIC HISTORICAL  05/20/2016   IR RADIOLOGIST EVAL & MGMT 05/20/2016 [facility]  . NEPHROLITHOTOMY  04-1999   during pregnancy, left kidney  . RADIOLOGY WITH ANESTHESIA N/A 04/26/2016   Procedure: EMBOLIZATION;  Surgeon: Medication Radiologist, MD;  Location: [place];  Service: Radiology;  Laterality: N/A;  . RADIOLOGY WITH ANESTHESIA N/A 05/05/2016   Procedure: EMBOLIZATION;   Surgeon: Deborah Bras, MD;  Location: [place];  Service: Radiology;  Laterality: N/A;  . skin cancer removal    . WISDOM TOOTH EXTRACTION  1984    Home Medications:  Current Facility-Administered Medications  Medication Dose Route Frequency Provider Last Rate Last Dose  . ceFAZolin (ANCEF) IVPB 2g/100 mL premix  2 g Intravenous 30 min Pre-Op Deborah Thompson, Deborah Furbish, MD      . lactated ringers infusion   Intravenous Continuous Deborah Rainwater, MD 75 mL/hr at 07/11/17 1127     Allergies: No Known Allergies  Family History  Problem Relation Age of Onset  . Heart disease Mother   . Heart disease Father   . Colon cancer Neg Hx    Social History:  reports that she has quit smoking. Her smoking use included cigarettes. She has a 4.00 pack-year smoking history. she has never used smokeless tobacco. She reports that she drinks alcohol. She reports that she does not use drugs.  Review of Systems  All other systems reviewed and are negative.   Physical Exam:  Vital signs in last 24 hours: Temp:  [98.4 F (36.9 C)] 98.4 F (36.9 C) (12/10 1049) Pulse Rate:  [64] 64 (12/10 1049) Resp:  [18] 18 (12/10 1049) BP: (128)/(80) 128/80 (12/10 1049) SpO2:  [100 %] 100 % (12/10 1049) Weight:  [79.8 kg (176 lb)] 79.8 kg (176 lb) (12/10 1113) Physical Exam  Constitutional: She is oriented to  person, place, and time. She appears well-developed and well-nourished.  HENT:  Head: Normocephalic and atraumatic.  Eyes: EOM are normal. Pupils are equal, round, and reactive to light.  Neck: Normal range of motion. No thyromegaly present.  Cardiovascular: Normal rate and regular rhythm.  Respiratory: Effort normal. No respiratory distress.  GI: Soft. She exhibits no distension.  Musculoskeletal: Normal range of motion. She exhibits no edema.  Neurological: She is alert and oriented to person, place, and time.  Skin: Skin is warm and dry.  Psychiatric: She has a normal mood and affect. Her behavior is  normal. Judgment and thought content normal.    Laboratory Data:  No results found for this or any previous visit (from the past 24 hour(s)). No results found for this or any previous visit (from the past 240 hour(s)). Creatinine: Recent Labs    07/08/17 0934  CREATININE 0.78   Baseline Creatinine: 0.8  Impression/Assessment:  51yo with large left renal calculus  Plan:  The risks/benefits/alternatives to L PCNL was explained to the patient and she understands and wishes to proceed with surgery  Nicolette Bang 07/11/2017, 12:46 PM

## 2017-07-11 NOTE — Anesthesia Procedure Notes (Signed)
Procedure Name: Intubation Date/Time: 07/11/2017 2:20 PM Performed by: Brittiany Wiehe D, CRNA Pre-anesthesia Checklist: Patient identified, Emergency Drugs available, Suction available and Patient being monitored Patient Re-evaluated:Patient Re-evaluated prior to induction Oxygen Delivery Method: Circle system utilized Preoxygenation: Pre-oxygenation with 100% oxygen Induction Type: IV induction Ventilation: Mask ventilation without difficulty Laryngoscope Size: Mac and 4 Grade View: Grade III Tube type: Oral Number of attempts: 1 Airway Equipment and Method: Stylet,  Oral airway,  Bougie stylet,  Rigid stylet,  Video-laryngoscopy and Fiberoptic brochoscope Placement Confirmation: ETT inserted through vocal cords under direct vision,  positive ETCO2 and breath sounds checked- equal and bilateral Secured at: 7 cm Tube secured with: Tape Dental Injury: Teeth and Oropharynx as per pre-operative assessment  Difficulty Due To: Difficulty was anticipated, Difficult Airway- due to reduced neck mobility, Difficult Airway- due to anterior larynx and Difficult Airway- due to limited oral opening

## 2017-07-11 NOTE — Anesthesia Postprocedure Evaluation (Signed)
Anesthesia Post Note  Patient: Deborah Thompson  Procedure(s) Performed: NEPHROLITHOTOMY PERCUTANEOUS (Left Renal) CYSTOSCOPY WITH STENT PLACEMENT (Left Ureter) HOLMIUM LASER APPLICATION (Left )     Patient location during evaluation: PACU Anesthesia Type: General Level of consciousness: awake and alert Pain management: pain level controlled Vital Signs Assessment: post-procedure vital signs reviewed and stable Respiratory status: spontaneous breathing, nonlabored ventilation and respiratory function stable Cardiovascular status: blood pressure returned to baseline and stable Postop Assessment: no apparent nausea or vomiting Anesthetic complications: no    Last Vitals:  Vitals:   07/11/17 1808 07/11/17 1815  BP: 137/79 139/70  Pulse: 98 (!) 109  Resp:  14  Temp: 37.1 C 37 C  SpO2: 96% 97%    Last Pain:  Vitals:   07/11/17 1833  TempSrc:   PainSc: [place]

## 2017-07-11 NOTE — Anesthesia Preprocedure Evaluation (Signed)
Anesthesia Evaluation  Patient identified by MRN, date of birth, ID band Patient awake    Reviewed: Allergy & Precautions, NPO status , Patient's Chart, lab work & pertinent test results  Airway Mallampati: IV  TM Distance: >3 FB Neck ROM: Full    Dental  (+) Teeth Intact, Dental Advisory Given   Pulmonary neg pulmonary ROS, former smoker,    breath sounds clear to auscultation       Cardiovascular hypertension, Pt. on medications  Rhythm:Regular Rate:Normal     Neuro/Psych  Headaches, PSYCHIATRIC DISORDERS Depression CVA    GI/Hepatic Neg liver ROS, GERD  Medicated,  Endo/Other  negative endocrine ROS  Renal/GU negative Renal ROS  negative genitourinary   Musculoskeletal negative musculoskeletal ROS (+)   Abdominal   Peds negative pediatric ROS (+)  Hematology negative hematology ROS (+)   Anesthesia Other Findings   Reproductive/Obstetrics negative OB ROS                             Lab Results  Component Value Date   WBC 4.4 07/08/2017   HGB 12.5 07/08/2017   HCT 37.5 07/08/2017   MCV 89.5 07/08/2017   PLT 249 07/08/2017   Lab Results  Component Value Date   CREATININE 0.78 07/08/2017   BUN 14 07/08/2017   NA 142 07/08/2017   K 4.4 07/08/2017   CL 107 07/08/2017   CO2 29 07/08/2017   Lab Results  Component Value Date   INR 0.94 05/05/2016   INR 1.01 04/26/2016   INR 1.02 04/02/2016     11/2015 EKG: normal sinus rhythm.  01/2016 Echo - Left ventricle: The cavity size was normal. Wall thickness was   normal. Systolic function was normal. The estimated ejection   fraction was in the range of 60% to 65%. Wall motion was normal;   there were no regional wall motion abnormalities. Doppler   parameters are consistent with abnormal left ventricular   relaxation (grade 1 diastolic dysfunction).  Anesthesia Physical  Anesthesia Plan  ASA: III  Anesthesia Plan: General    Post-op Pain Management:    Induction: Intravenous  PONV Risk Score and Plan: 3 and Ondansetron, Dexamethasone and Midazolam  Airway Management Planned: LMA  Additional Equipment:   Intra-op Plan:   Post-operative Plan: Extubation in OR  Informed Consent: I have reviewed the patients History and Physical, chart, labs and discussed the procedure including the risks, benefits and alternatives for the proposed anesthesia with the patient or authorized representative who has indicated his/her understanding and acceptance.   Dental advisory given  Plan Discussed with: CRNA  Anesthesia Plan Comments:         Anesthesia Quick Evaluation

## 2017-07-12 ENCOUNTER — Encounter (HOSPITAL_COMMUNITY): Payer: Self-pay | Admitting: Urology

## 2017-07-12 DIAGNOSIS — Z79899 Other long term (current) drug therapy: Secondary | ICD-10-CM | POA: Diagnosis not present

## 2017-07-12 DIAGNOSIS — E7800 Pure hypercholesterolemia, unspecified: Secondary | ICD-10-CM | POA: Diagnosis not present

## 2017-07-12 DIAGNOSIS — K219 Gastro-esophageal reflux disease without esophagitis: Secondary | ICD-10-CM | POA: Diagnosis not present

## 2017-07-12 DIAGNOSIS — N200 Calculus of kidney: Secondary | ICD-10-CM | POA: Diagnosis not present

## 2017-07-12 DIAGNOSIS — Z87442 Personal history of urinary calculi: Secondary | ICD-10-CM | POA: Diagnosis not present

## 2017-07-12 DIAGNOSIS — I10 Essential (primary) hypertension: Secondary | ICD-10-CM | POA: Diagnosis not present

## 2017-07-12 DIAGNOSIS — Z8673 Personal history of transient ischemic attack (TIA), and cerebral infarction without residual deficits: Secondary | ICD-10-CM | POA: Diagnosis not present

## 2017-07-12 DIAGNOSIS — Z7982 Long term (current) use of aspirin: Secondary | ICD-10-CM | POA: Diagnosis not present

## 2017-07-12 DIAGNOSIS — F329 Major depressive disorder, single episode, unspecified: Secondary | ICD-10-CM | POA: Diagnosis not present

## 2017-07-12 LAB — BASIC METABOLIC PANEL
Anion gap: 7 (ref 5–15)
BUN: 16 mg/dL (ref 6–20)
CALCIUM: 9 mg/dL (ref 8.9–10.3)
CO2: 29 mmol/L (ref 22–32)
Chloride: 103 mmol/L (ref 101–111)
Creatinine, Ser: 0.74 mg/dL (ref 0.44–1.00)
GFR calc Af Amer: >60 mL/min (ref >60)
GLUCOSE: 135 mg/dL — AB (ref 65–99)
POTASSIUM: 4.2 mmol/L (ref 3.5–5.1)
SODIUM: 139 mmol/L (ref 135–145)

## 2017-07-12 LAB — CBC
HCT: 35.1 % — ABNORMAL LOW (ref 36.0–46.0)
Hemoglobin: 11.9 g/dL — ABNORMAL LOW (ref 12.0–15.0)
MCH: 30 pg (ref 26.0–34.0)
MCHC: 33.9 g/dL (ref 30.0–36.0)
MCV: 88.4 fL (ref 78.0–100.0)
PLATELETS: 212 10*3/uL (ref 150–400)
RBC: 3.97 MIL/uL (ref 3.87–5.11)
RDW: 12.7 % (ref 11.5–15.5)
WBC: 9.3 10*3/uL (ref 4.0–10.5)

## 2017-07-12 MED ORDER — OXYCODONE-ACETAMINOPHEN 5-325 MG PO TABS: 1.0000 | ORAL_TABLET | ORAL | 0 refills | Status: DC | PRN

## 2017-07-12 MED FILL — OXYCOD/ACETAMINOPHEN 5-325M: 5-325 | 3 days supply | Qty: 30 | Fill #0

## 2017-07-12 NOTE — Care Management Note (Signed)
Case Management Note  Patient Details  Name: Deborah Thompson MRN: [id number] Date of Birth: [birth year]  Subjective/Objective: 51 y/o f admitted w/Renal calculus. From home.                   Action/Plan:d/c home.   Expected Discharge Date:  07/12/17               Expected Discharge Plan:  Home/Self Care  In-House Referral:     Discharge planning Services  CM Consult  Post Acute Care Choice:    Choice offered to:     DME Arranged:    DME Agency:     HH Arranged:    HH Agency:     Status of Service:  Completed, signed off  If discussed at [employer] of Stay Meetings, dates discussed:    Additional Comments:  Dessa Phi, RN 07/12/2017, 11:48 AM

## 2017-07-12 NOTE — Discharge Instructions (Signed)
Percutaneous Nephrostomy  Percutaneous nephrostomy is a procedure to insert a flexible tube into your kidney so that urine can leave your body. This procedure may be done if a medical condition prevents urine from leaving your kidney in the usual way. Urine is normally carried from the kidneys to the bladder through narrow tubes called ureters. A ureter can become blocked because of conditions such as kidney stones, tumors, infection, or blood clots.  The nephrostomy tube will be inserted through your back. After the procedure, the tube will remain in place, and urine will drain from the kidney into a drainage bag outside your body. Draining the urine will relieve pressure and help prevent infection that could damage the kidney. Often, this procedure allows your health care provider to identify the cause of the blockage and plan appropriate treatment.  Tell a health care provider about:   Any allergies you have.   All medicines you are taking, including vitamins, herbs, eye drops, creams, and over-the-counter medicines.   Any problems you or family members have had with anesthetic medicines.   Any blood disorders you have.   Any surgeries you have had.   Any medical conditions you have.   Whether you are pregnant or may be pregnant.  What are the risks?  Generally, this is a safe procedure. However, problems may occur, including:   Infection.   Bleeding.   Allergic reactions to medicines or dyes used in the procedure.   Damage to other structures or organs.    What happens before the procedure?   Follow instructions from your health care provider about eating or drinking restrictions.   Ask your health care provider about:  ? Changing or stopping your regular medicines. This is especially important if you are taking diabetes medicines or blood thinners.  ? Taking medicines such as aspirin and ibuprofen. These medicines can thin your blood. Do not take these medicines before your procedure if your health  care provider instructs you not to.   You may be given antibiotic medicine to help prevent infection.   You may have blood tests to see how well your kidneys and liver are working and to see how well your blood can clot.   Plan to have someone take you home from the hospital or clinic.  What happens during the procedure?   To reduce your risk of infection:  ? Your health care team will wash or sanitize their hands.  ? Your skin will be washed with soap.   An IV tube will be inserted into one of your veins. You may be given medicines through this IV tube to help prevent nausea and pain.   You will be positioned on your abdomen.   You will be given one or more of the following:  ? A medicine to help you relax (sedative).  ? A medicine to numb the area (local anesthetic) where the nephrostomy tube will be inserted.   The nephrostomy tube, which is thin and flexible, will be inserted into a needle.   The needle will be inserted into your body and guided to your kidney. An imaging method that uses X-ray images (fluoroscopy) will be used to help guide the needle to the kidney.   A dye will be injected through the nephrostomy tube. Then, X-ray images that highlight your kidney will be taken.   Next, the needle will be removed, but the nephrostomy tube will be left in your kidney. The tube may be secured to your skin   with stitches (sutures).   A drainage bag will be attached to the nephrostomy tube. Urine will be able to drain from your kidney to this drainage bag outside your body.  The procedure may vary among health care providers and hospitals.  What happens after the procedure?   Your blood pressure, heart rate, breathing rate, and blood oxygen level will be monitored until the medicines you were given have worn off.   You will need to remain lying down for several hours.   You will be taught how to care for the nephrostomy tube and the drainage bag.   Donot drive for 24 hours if you were given a  sedative.  This information is not intended to replace advice given to you by your health care provider. Make sure you discuss any questions you have with your health care provider.  Document Released: 05/09/2013 Document Revised: 04/30/2016 Document Reviewed: 04/30/2016  [employer]vier Interactive Patient Education  2018 [employer]

## 2017-07-14 DIAGNOSIS — N200 Calculus of kidney: Secondary | ICD-10-CM | POA: Diagnosis not present

## 2017-07-18 ENCOUNTER — Other Ambulatory Visit: Payer: Self-pay | Admitting: Urology

## 2017-07-19 ENCOUNTER — Other Ambulatory Visit (HOSPITAL_COMMUNITY): Payer: Self-pay | Admitting: *Deleted

## 2017-07-19 ENCOUNTER — Encounter (HOSPITAL_COMMUNITY): Payer: Self-pay | Admitting: *Deleted

## 2017-07-19 ENCOUNTER — Other Ambulatory Visit: Payer: Self-pay

## 2017-07-20 NOTE — Discharge Summary (Signed)
Physician Discharge Summary  Patient ID: Deborah Thompson MRN: [id number] DOB/AGE: November 04, 1965 51 y.o.  Admit date: 07/11/2017 Discharge date: 07/12/2017  Admission Diagnoses: Renal calculus  Discharge Diagnoses:  Active Problems:   Renal calculus   Discharged Condition: good  Hospital Course: The patient tolerated the procedure well and was transferred to the floor on IV pain meds, IV fluid. On POD#1 foley was removed, pt was started on regular diet and they ambulated in the halls.  Prior to discharge the pt was tolerating a regular diet, pain was controlled on PO pain meds, they were ambulating without difficulty, and they had normal bowel function.   Consults: None  Significant Diagnostic Studies: none  Treatments: surgery: PCNL  Discharge Exam: Blood pressure 127/67, pulse 86, temperature 98.1 F (36.7 C), temperature source Oral, resp. rate 16, height 5\' 6"  (1.676 m), weight 79.8 kg (176 lb), SpO2 100 %. General appearance: alert, cooperative and appears stated age Head: Normocephalic, without obvious abnormality, atraumatic Nose: Nares normal. Septum midline. Mucosa normal. No drainage or sinus tenderness. Back: symmetric, no curvature. ROM normal. No CVA tenderness. Resp: clear to auscultation bilaterally Cardio: regular rate and rhythm, S1, S2 normal, no murmur, click, rub or gallop GI: soft, non-tender; bowel sounds normal; no masses,  no organomegaly Extremities: extremities normal, atraumatic, no cyanosis or edema Skin: Skin color, texture, turgor normal. No rashes or lesions Neurologic: Grossly normal  Disposition: 01-Home or Self Care  Discharge Instructions    Discharge patient   Complete by:  As directed    Discharge disposition:  01-Home or Self Care   Discharge patient date:  07/12/2017     Allergies as of 07/12/2017   No Known Allergies     Medication List    TAKE these medications   acetaminophen 500 MG tablet Commonly known as:  TYLENOL Take  500 mg by mouth every 6 (six) hours as needed (for pain.).   aspirin EC 81 MG tablet Take 81 mg by mouth daily.   atorvastatin 40 MG tablet Commonly known as:  LIPITOR Take 40 mg by mouth at bedtime.   CALCIUM PLUS VITAMIN D3 PO Take 1 tablet by mouth 2 (two) times daily.   Fish Oil 1000 MG Cpdr Take 1,000 mg by mouth 2 (two) times daily.   Glucosamine HCl 1000 MG Tabs Take 2,000 mg by mouth daily.   lisinopril 40 MG tablet Commonly known as:  PRINIVIL,ZESTRIL Take 40 mg by mouth at bedtime.   metoprolol succinate 100 MG 24 hr tablet Commonly known as:  TOPROL-XL Take 50 mg by mouth at bedtime.   MIRENA (52 MG) 20 MCG/24HR IUD Generic drug:  levonorgestrel 1 each by Intrauterine route once.   multivitamin with minerals Tabs tablet Take 1 tablet by mouth at bedtime.   oxyCODONE-acetaminophen 5-325 MG tablet Commonly known as:  PERCOCET/ROXICET Take 1-2 tablets by mouth every 4 (four) hours as needed for moderate pain.   pantoprazole 40 MG tablet Commonly known as:  PROTONIX Take 40 mg by mouth at bedtime.   spironolactone 25 MG tablet Commonly known as:  ALDACTONE Take 0.5 tablets (12.5 mg total) by mouth daily.   venlafaxine XR 75 MG 24 hr capsule Commonly known as:  EFFEXOR-XR Take 75 mg by mouth daily with breakfast.      Follow-up Information    Tishara Pizano, Candee Furbish, MD. Call on 07/14/2017.   Specialty:  Urology Contact information: [address] [place] [address] [phone]  SignedNicolette Bang 07/20/2017, 1:29 PM

## 2017-07-21 ENCOUNTER — Encounter (HOSPITAL_COMMUNITY): Payer: Self-pay | Admitting: *Deleted

## 2017-07-21 ENCOUNTER — Ambulatory Visit (HOSPITAL_COMMUNITY)
Admission: RE | Admit: 2017-07-21 | Discharge: 2017-07-21 | Disposition: A | Discharge status: Home / Self Care | Payer: 59 | Source: Ambulatory Visit | Attending: Urology | Admitting: Urology

## 2017-07-21 ENCOUNTER — Ambulatory Visit (HOSPITAL_COMMUNITY): Payer: 59 | Admitting: Certified Registered Nurse Anesthetist

## 2017-07-21 ENCOUNTER — Encounter (HOSPITAL_COMMUNITY)
Admission: RE | Disposition: A | Discharge status: Home / Self Care | Payer: Self-pay | Source: Ambulatory Visit | Attending: Urology

## 2017-07-21 ENCOUNTER — Ambulatory Visit (HOSPITAL_COMMUNITY): Payer: 59

## 2017-07-21 DIAGNOSIS — Z87442 Personal history of urinary calculi: Secondary | ICD-10-CM | POA: Diagnosis not present

## 2017-07-21 DIAGNOSIS — I739 Peripheral vascular disease, unspecified: Secondary | ICD-10-CM | POA: Diagnosis not present

## 2017-07-21 DIAGNOSIS — N200 Calculus of kidney: Secondary | ICD-10-CM | POA: Insufficient documentation

## 2017-07-21 DIAGNOSIS — K219 Gastro-esophageal reflux disease without esophagitis: Secondary | ICD-10-CM | POA: Insufficient documentation

## 2017-07-21 DIAGNOSIS — F329 Major depressive disorder, single episode, unspecified: Secondary | ICD-10-CM | POA: Diagnosis not present

## 2017-07-21 DIAGNOSIS — Z79899 Other long term (current) drug therapy: Secondary | ICD-10-CM | POA: Diagnosis not present

## 2017-07-21 DIAGNOSIS — Z87891 Personal history of nicotine dependence: Secondary | ICD-10-CM | POA: Diagnosis not present

## 2017-07-21 DIAGNOSIS — I10 Essential (primary) hypertension: Secondary | ICD-10-CM | POA: Insufficient documentation

## 2017-07-21 DIAGNOSIS — E7800 Pure hypercholesterolemia, unspecified: Secondary | ICD-10-CM | POA: Diagnosis not present

## 2017-07-21 DIAGNOSIS — Z8673 Personal history of transient ischemic attack (TIA), and cerebral infarction without residual deficits: Secondary | ICD-10-CM | POA: Insufficient documentation

## 2017-07-21 HISTORY — PX: NEPHROLITHOTOMY: SHX5134

## 2017-07-21 HISTORY — DX: Failed or difficult intubation, initial encounter: T88.4XXA

## 2017-07-21 HISTORY — DX: Other specified postprocedural states: Z98.890

## 2017-07-21 HISTORY — DX: Nausea with vomiting, unspecified: R11.2

## 2017-07-21 SURGERY — NEPHROLITHOTOMY PERCUTANEOUS SECOND LOOK
Anesthesia: General | Laterality: Left

## 2017-07-21 MED ORDER — PROPOFOL 10 MG/ML IV BOLUS
INTRAVENOUS | Status: DC | PRN
Start: 2017-07-21 — End: 2017-07-21
  Administered 2017-07-21: 170 mg via INTRAVENOUS

## 2017-07-21 MED ORDER — DEXAMETHASONE SODIUM PHOSPHATE 10 MG/ML IJ SOLN
INTRAMUSCULAR | Status: DC | PRN
  Administered 2017-07-21: 10 mg via INTRAVENOUS

## 2017-07-21 MED ORDER — OXYCODONE-ACETAMINOPHEN 5-325 MG PO TABS: 1.0000 | ORAL_TABLET | ORAL | Status: DC | PRN

## 2017-07-21 MED ORDER — PROPOFOL 10 MG/ML IV BOLUS
INTRAVENOUS | Status: AC
  Filled 2017-07-21: qty 20

## 2017-07-21 MED ORDER — LIDOCAINE 2% (20 MG/ML) 5 ML SYRINGE
INTRAMUSCULAR | Status: DC | PRN
  Administered 2017-07-21: 50 mg via INTRAVENOUS

## 2017-07-21 MED ORDER — OXYCODONE HCL 5 MG/5ML PO SOLN
5.0000 mg | Freq: Once | ORAL | Status: DC | PRN
  Filled 2017-07-21: qty 5

## 2017-07-21 MED ORDER — LACTATED RINGERS IV SOLN
INTRAVENOUS | Status: DC
  Administered 2017-07-21: 11:00:00 via INTRAVENOUS
  Administered 2017-07-21: 1000 mL via INTRAVENOUS

## 2017-07-21 MED ORDER — IOHEXOL 300 MG/ML  SOLN
INTRAMUSCULAR | Status: DC | PRN
  Administered 2017-07-21: 5 mL

## 2017-07-21 MED ORDER — MIDAZOLAM HCL 5 MG/5ML IJ SOLN
INTRAMUSCULAR | Status: DC | PRN
  Administered 2017-07-21: 2 mg via INTRAVENOUS

## 2017-07-21 MED ORDER — SUGAMMADEX SODIUM 200 MG/2ML IV SOLN
INTRAVENOUS | Status: DC | PRN
  Administered 2017-07-21: 160 mg via INTRAVENOUS

## 2017-07-21 MED ORDER — FENTANYL CITRATE (PF) 100 MCG/2ML IJ SOLN
INTRAMUSCULAR | Status: DC | PRN
  Administered 2017-07-21: 50 ug via INTRAVENOUS
  Administered 2017-07-21: 100 ug via INTRAVENOUS

## 2017-07-21 MED ORDER — 0.9 % SODIUM CHLORIDE (POUR BTL) OPTIME
TOPICAL | Status: DC | PRN
  Administered 2017-07-21: 1000 mL

## 2017-07-21 MED ORDER — SCOPOLAMINE 1 MG/3DAYS TD PT72
MEDICATED_PATCH | TRANSDERMAL | Status: AC
  Filled 2017-07-21: qty 1

## 2017-07-21 MED ORDER — SODIUM CHLORIDE 0.9 % IR SOLN
Status: DC | PRN
  Administered 2017-07-21: 3000 mL

## 2017-07-21 MED ORDER — FENTANYL CITRATE (PF) 100 MCG/2ML IJ SOLN: 25.0000 ug | INTRAMUSCULAR | Status: DC | PRN

## 2017-07-21 MED ORDER — OXYCODONE HCL 5 MG PO TABS: 5.0000 mg | ORAL_TABLET | Freq: Once | ORAL | Status: DC | PRN

## 2017-07-21 MED ORDER — MIDAZOLAM HCL 2 MG/2ML IJ SOLN
INTRAMUSCULAR | Status: AC
  Filled 2017-07-21: qty 2

## 2017-07-21 MED ORDER — OXYCODONE-ACETAMINOPHEN 5-325 MG PO TABS: 1.0000 | ORAL_TABLET | ORAL | 0 refills | Status: DC | PRN

## 2017-07-21 MED ORDER — FENTANYL CITRATE (PF) 250 MCG/5ML IJ SOLN
INTRAMUSCULAR | Status: AC
  Filled 2017-07-21: qty 5

## 2017-07-21 MED ORDER — SCOPOLAMINE 1 MG/3DAYS TD PT72
MEDICATED_PATCH | TRANSDERMAL | Status: DC | PRN
  Administered 2017-07-21: 1 via TRANSDERMAL

## 2017-07-21 MED ORDER — CEFAZOLIN SODIUM-DEXTROSE 2-4 GM/100ML-% IV SOLN
2.0000 g | INTRAVENOUS | Status: AC
  Administered 2017-07-21: 2 g via INTRAVENOUS
  Filled 2017-07-21: qty 100

## 2017-07-21 MED ORDER — ROCURONIUM BROMIDE 10 MG/ML (PF) SYRINGE
PREFILLED_SYRINGE | INTRAVENOUS | Status: DC | PRN
  Administered 2017-07-21: 50 mg via INTRAVENOUS

## 2017-07-21 MED ORDER — ONDANSETRON HCL 4 MG/2ML IJ SOLN: 4.0000 mg | Freq: Four times a day (QID) | INTRAMUSCULAR | Status: DC | PRN

## 2017-07-21 SURGICAL SUPPLY — 66 items
APL SKNCLS STERI-STRIP NONHPOA (GAUZE/BANDAGES/DRESSINGS)
BAG URINE DRAINAGE (UROLOGICAL SUPPLIES) IMPLANT
BASKET STONE NITINOL 3FRX115MB (UROLOGICAL SUPPLIES) ×3 IMPLANT
BASKET ZERO TIP NITINOL 2.4FR (BASKET) IMPLANT
BENZOIN TINCTURE PRP APPL 2/3 (GAUZE/BANDAGES/DRESSINGS) IMPLANT
BLADE SURG 15 STRL LF DISP TIS (BLADE) IMPLANT
BLADE SURG 15 STRL SS (BLADE)
BSKT STON RTRVL ZERO TP 2.4FR (BASKET)
CATH FOLEY 2W COUNCIL 20FR 5CC (CATHETERS) IMPLANT
CATH FOLEY 2WAY SLVR  5CC 16FR (CATHETERS)
CATH FOLEY 2WAY SLVR  5CC 18FR (CATHETERS)
CATH FOLEY 2WAY SLVR 5CC 16FR (CATHETERS) IMPLANT
CATH FOLEY 2WAY SLVR 5CC 18FR (CATHETERS) IMPLANT
CATH IMAGER II 65CM (CATHETERS) IMPLANT
CATH INTERMIT  6FR 70CM (CATHETERS) IMPLANT
CATH ROBINSON RED A/P 20FR (CATHETERS) IMPLANT
CATH URET DUAL LUMEN 6-10FR 50 (CATHETERS) IMPLANT
CATH X-FORCE N30 NEPHROSTOMY (TUBING) IMPLANT
COVER SURGICAL LIGHT HANDLE (MISCELLANEOUS) IMPLANT
DRAPE C-ARM 42X120 X-RAY (DRAPES) ×3 IMPLANT
DRAPE LINGEMAN PERC (DRAPES) ×3 IMPLANT
DRAPE SHEET LG 3/4 BI-LAMINATE (DRAPES) IMPLANT
DRAPE SURG IRRIG POUCH 19X23 (DRAPES) ×3 IMPLANT
DRSG PAD ABDOMINAL 8X10 ST (GAUZE/BANDAGES/DRESSINGS) IMPLANT
DRSG TEGADERM 4X4.75 (GAUZE/BANDAGES/DRESSINGS) ×3 IMPLANT
DRSG TEGADERM 8X12 (GAUZE/BANDAGES/DRESSINGS) IMPLANT
FIBER LASER FLEXIVA 1000 (UROLOGICAL SUPPLIES) IMPLANT
FIBER LASER FLEXIVA 365 (UROLOGICAL SUPPLIES) IMPLANT
FIBER LASER FLEXIVA 550 (UROLOGICAL SUPPLIES) IMPLANT
FIBER LASER TRAC TIP (UROLOGICAL SUPPLIES) IMPLANT
GAUZE SPONGE 2X2 8PLY STRL LF (GAUZE/BANDAGES/DRESSINGS) ×1 IMPLANT
GAUZE SPONGE 4X4 12PLY STRL (GAUZE/BANDAGES/DRESSINGS) IMPLANT
GLOVE BIO SURGEON STRL SZ8 (GLOVE) ×3 IMPLANT
GLOVE BIOGEL PI IND STRL 8 (GLOVE) IMPLANT
GLOVE BIOGEL PI INDICATOR 8 (GLOVE)
GOWN STRL REUS W/TWL LRG LVL3 (GOWN DISPOSABLE) IMPLANT
GOWN STRL REUS W/TWL XL LVL3 (GOWN DISPOSABLE) ×3 IMPLANT
GUIDEWIRE STR DUAL SENSOR (WIRE) IMPLANT
IV SET EXTENSION CATH 6 NF (IV SETS) IMPLANT
KIT BASIN OR (CUSTOM PROCEDURE TRAY) ×3 IMPLANT
MANIFOLD NEPTUNE II (INSTRUMENTS) ×3 IMPLANT
NEEDLE TROCAR 18X15 ECHO (NEEDLE) IMPLANT
NEEDLE TROCAR 18X20 (NEEDLE) IMPLANT
NS IRRIG 1000ML POUR BTL (IV SOLUTION) IMPLANT
PACK CYSTO (CUSTOM PROCEDURE TRAY) ×3 IMPLANT
PROBE LITHOCLAST ULTRA 3.8X403 (UROLOGICAL SUPPLIES) IMPLANT
PROBE PNEUMATIC 1.0MMX570MM (UROLOGICAL SUPPLIES) IMPLANT
SET AMPLATZ RENAL DILATOR (MISCELLANEOUS) IMPLANT
SET IRRIG Y TYPE TUR BLADDER L (SET/KITS/TRAYS/PACK) IMPLANT
SHEATH PEELAWAY SET 9 (SHEATH) IMPLANT
SPONGE GAUZE 2X2 STER 10/PKG (GAUZE/BANDAGES/DRESSINGS) ×2
SPONGE LAP 4X18 X RAY DECT (DISPOSABLE) ×3 IMPLANT
STENT CONTOUR 6FRX26X.038 (STENTS) IMPLANT
STONE CATCHER W/TUBE ADAPTER (UROLOGICAL SUPPLIES) IMPLANT
SUT SILK 2 0 30  PSL (SUTURE)
SUT SILK 2 0 30 PSL (SUTURE) IMPLANT
SUT VIC AB 2-0 SH 27 (SUTURE) ×3
SUT VIC AB 2-0 SH 27X BRD (SUTURE) ×1 IMPLANT
SYR 10ML LL (SYRINGE) IMPLANT
SYR 20CC LL (SYRINGE) ×3 IMPLANT
SYR 50ML LL SCALE MARK (SYRINGE) IMPLANT
TOWEL OR 17X26 10 PK STRL BLUE (TOWEL DISPOSABLE) ×3 IMPLANT
TRAY FOLEY CATH 16FRSI W/METER (SET/KITS/TRAYS/PACK) ×3 IMPLANT
TUBING CONNECTING 10 (TUBING) ×2 IMPLANT
TUBING CONNECTING 10' (TUBING) ×1
WATER STERILE IRR 1000ML POUR (IV SOLUTION) IMPLANT

## 2017-07-21 NOTE — OR Nursing (Signed)
Stone taken per Dr. McKenzie. 

## 2017-07-21 NOTE — Discharge Instructions (Signed)
You have a scopolamine patch behind your left ear. Remove it 07/22/17 and was the skin with soap and water. Be careful not to get any ointment on your fingers. The ointment will make your eyes dilate.     Ureteral Stent Implantation, Care After Refer to this sheet in the next few weeks. These instructions provide you with information about caring for yourself after your procedure. Your health care provider may also give you more specific instructions. Your treatment has been planned according to current medical practices, but problems sometimes occur. Call your health care provider if you have any problems or questions after your procedure. What can I expect after the procedure? After the procedure, it is common to have:  Nausea.  Mild pain when you urinate. You may feel this pain in your lower back or lower abdomen. Pain should stop within a few minutes after you urinate. This may last for up to 1 week.  A small amount of blood in your urine for several days.  Follow these instructions at home:  Medicines  Take over-the-counter and prescription medicines only as told by your health care provider.  If you were prescribed an antibiotic medicine, take it as told by your health care provider. Do not stop taking the antibiotic even if you start to feel better.  Do not drive for 24 hours if you received a sedative.  Do not drive or operate heavy machinery while taking prescription pain medicines. Activity  Return to your normal activities as told by your health care provider. Ask your health care provider what activities are safe for you.  Do not lift anything that is heavier than 10 lb (4.5 kg). Follow this limit for 1 week after your procedure, or for as long as told by your health care provider. General instructions  Watch for any blood in your urine. Call your health care provider if the amount of blood in your urine increases.  If you have a catheter: ? Follow instructions from  your health care provider about taking care of your catheter and collection bag. ? Do not take baths, swim, or use a hot tub until your health care provider approves.  Drink enough fluid to keep your urine clear or pale yellow.  Keep all follow-up visits as told by your health care provider. This is important. Contact a health care provider if:  You have pain that gets worse or does not get better with medicine, especially pain when you urinate.  You have difficulty urinating.  You feel nauseous or you vomit repeatedly during a period of more than 2 days after the procedure. Get help right away if:  Your urine is dark red or has blood clots in it.  You are leaking urine (have incontinence).  The end of the stent comes out of your urethra.  You cannot urinate.  You have sudden, sharp, or severe pain in your abdomen or lower back.  You have a fever. This information is not intended to replace advice given to you by your health care provider. Make sure you discuss any questions you have with your health care provider. Document Released: 03/21/2013 Document Revised: 12/25/2015 Document Reviewed: 01/31/2015 Elsevier Interactive Patient Education  2018 [employer] Anesthesia, Adult, Care After These instructions provide you with information about caring for yourself after your procedure. Your health care provider may also give you more specific instructions. Your treatment has been planned according to current medical practices, but problems sometimes occur. Call  your health care provider if you have any problems or questions after your procedure. What can I expect after the procedure? After the procedure, it is common to have:  Vomiting.  A sore throat.  Mental slowness.  It is common to feel:  Nauseous.  Cold or shivery.  Sleepy.  Tired.  Sore or achy, even in parts of your body where you did not have surgery.  Follow these instructions at home: For  at least 24 hours after the procedure:  Do not: ? Participate in activities where you could fall or become injured. ? Drive. ? Use heavy machinery. ? Drink alcohol. ? Take sleeping pills or medicines that cause drowsiness. ? Make important decisions or sign legal documents. ? Take care of children on your own.  Rest. Eating and drinking  If you vomit, drink water, juice, or soup when you can drink without vomiting.  Drink enough fluid to keep your urine clear or pale yellow.  Make sure you have little or no nausea before eating solid foods.  Follow the diet recommended by your health care provider. General instructions  Have a responsible adult stay with you until you are awake and alert.  Return to your normal activities as told by your health care provider. Ask your health care provider what activities are safe for you.  Take over-the-counter and prescription medicines only as told by your health care provider.  If you smoke, do not smoke without supervision.  Keep all follow-up visits as told by your health care provider. This is important. Contact a health care provider if:  You continue to have nausea or vomiting at home, and medicines are not helpful.  You cannot drink fluids or start eating again.  You cannot urinate after 8-12 hours.  You develop a skin rash.  You have fever.  You have increasing redness at the site of your procedure. Get help right away if:  You have difficulty breathing.  You have chest pain.  You have unexpected bleeding.  You feel that you are having a life-threatening or urgent problem. This information is not intended to replace advice given to you by your health care provider. Make sure you discuss any questions you have with your health care provider. Document Released: 10/25/2000 Document Revised: 12/22/2015 Document Reviewed: 07/03/2015 Elsevier Interactive Patient Education  [employer].

## 2017-07-21 NOTE — Transfer of Care (Signed)
Immediate Anesthesia Transfer of Care Note  Patient: Deborah Thompson  Procedure(s) Performed: Procedure(s): NEPHROLITHOTOMY PERCUTANEOUS SECOND LOOK (Left)  Patient Location: PACU  Anesthesia Type:General  Level of Consciousness:  sedated, patient cooperative and responds to stimulation  Airway & Oxygen Therapy:Patient Spontanous Breathing and Patient connected to face mask oxgen  Post-op Assessment:  Report given to PACU RN and Post -op Vital signs reviewed and stable  Post vital signs:  Reviewed and stable  Last Vitals:  Vitals:   07/21/17 1011  BP: 117/74  Pulse: 87  Resp: 18  Temp: 36.6 C  SpO2: 277%    Complications: No apparent anesthesia complications

## 2017-07-21 NOTE — Op Note (Signed)
Preoperative diagnosis: Left renal stone  Postoperative diagnosis: Same  Procedure 1.  Left percutaneous nephrostolithotomy for stone less than 2 cm 2.  Left nephrostogram 3.  Intraoperative fluoroscopy, under 1 hour, with interpretation  Attending: Dr. Alyson Ingles  Anesthesia: General  Estimated blood loss: Minimal  Antibiotics: ancef  Drains: 1.  6 x 26 left double-J ureteral stent   Specimens: Stone for analysis  Findings: lower pole calculi totaling 1.6cm.   Indications: Patient is a 51 year old female with a history of large left renal stones. She underwent first stage percutaneous nephrostolithotomy. She had residual calculi in the lower pole.  After discussing treatment options and decided she was left second look percutaneous nephrostolithotomy.  Patient already has a nephrostomy tube.  Procedure in detail: Prior to procedure consent was obtained.  Patient was brought to the operating room debridement was done to ensure correct patient, correct procedure, and correct site.  General anesthesia was administered.  A 16 French Foley catheter was in place.  The patient was then placed in the prone position.  His nephrostomy tube and left flank was then prepped and draped in usual sterile fashion.  A nephrostogram was obtained and findings noted above.  We then removed the nephrostomy tube and used the flexible nephroscope to perform nephroscopy. Using an in [facility] removed stone fragments in the lower pole and sent for composition analysis.  Once the fragments were removed we closed the incision with a 3-0 vicryl. Dressing was placed over the incision and this then concluded the procedure was well-tolerated by the patient.  Complications: None  Condition: Stable, extubated, transferred to PACU  Plan: Patient is to be discharge home and followup in 1 week for stent removal

## 2017-07-21 NOTE — Anesthesia Procedure Notes (Signed)
Procedure Name: Intubation Date/Time: 07/21/2017 12:41 PM Performed by: Anne Fu, CRNA Pre-anesthesia Checklist: Patient identified, Emergency Drugs available, Suction available, Patient being monitored and Timeout performed Patient Re-evaluated:Patient Re-evaluated prior to induction Oxygen Delivery Method: Circle system utilized Preoxygenation: Pre-oxygenation with 100% oxygen Induction Type: IV induction Ventilation: Mask ventilation without difficulty Laryngoscope Size: Mac and 4 Grade View: Grade I Tube type: Oral Tube size: 7.0 mm Number of attempts: 1 Airway Equipment and Method: Video-laryngoscopy Placement Confirmation: ETT inserted through vocal cords under direct vision,  positive ETCO2 and breath sounds checked- equal and bilateral Secured at: 20 cm Tube secured with: Tape Dental Injury: Teeth and Oropharynx as per pre-operative assessment  Difficulty Due To: Difficulty was anticipated, Difficult Airway- due to limited oral opening and Difficult Airway- due to anterior larynx Comments: Grade I with Video glidescope MAC 3 X 1 attempt atraumatic placement with noted bilat lung sounds post placement.

## 2017-07-21 NOTE — Anesthesia Preprocedure Evaluation (Signed)
Anesthesia Evaluation  Patient identified by MRN, date of birth, ID band Patient awake    Reviewed: Allergy & Precautions, H&P , NPO status , Patient's Chart, lab work & pertinent test results  History of Anesthesia Complications (+) PONV, DIFFICULT AIRWAY and history of anesthetic complications  Airway Mallampati: II   Neck ROM: full    Dental   Pulmonary former smoker,    breath sounds clear to auscultation       Cardiovascular hypertension, + Peripheral Vascular Disease   Rhythm:regular Rate:Normal     Neuro/Psych  Headaches, PSYCHIATRIC DISORDERS Depression CVA    GI/Hepatic GERD  ,  Endo/Other    Renal/GU      Musculoskeletal   Abdominal   Peds  Hematology   Anesthesia Other Findings   Reproductive/Obstetrics                             Anesthesia Physical Anesthesia Plan  ASA: III  Anesthesia Plan: General   Post-op Pain Management:    Induction: Intravenous  PONV Risk Score and Plan: 4 or greater and Ondansetron, Dexamethasone, Midazolam and Treatment may vary due to age or medical condition  Airway Management Planned: Oral ETT  Additional Equipment:   Intra-op Plan:   Post-operative Plan: Extubation in OR  Informed Consent: I have reviewed the patients History and Physical, chart, labs and discussed the procedure including the risks, benefits and alternatives for the proposed anesthesia with the patient or authorized representative who has indicated his/her understanding and acceptance.     Plan Discussed with: CRNA, Anesthesiologist and Surgeon  Anesthesia Plan Comments:         Anesthesia Quick Evaluation

## 2017-07-21 NOTE — Interval H&P Note (Signed)
History and Physical Interval Note:  07/21/2017 12:02 PM  Deborah Thompson  has presented today for surgery, with the diagnosis of LEFT RENAL STONES  The various methods of treatment have been discussed with the patient and family. After consideration of risks, benefits and other options for treatment, the patient has consented to  Procedure(s): NEPHROLITHOTOMY PERCUTANEOUS SECOND LOOK (Left) HOLMIUM LASER APPLICATION (Left) as a surgical intervention .  The patient's history has been reviewed, patient examined, no change in status, stable for surgery.  I have reviewed the patient's chart and labs.  Questions were answered to the patient's satisfaction.     Nicolette Bang

## 2017-07-22 ENCOUNTER — Encounter (HOSPITAL_COMMUNITY): Payer: Self-pay | Admitting: Urology

## 2017-07-22 NOTE — Anesthesia Postprocedure Evaluation (Signed)
Anesthesia Post Note  Patient: Deborah Thompson  Procedure(s) Performed: NEPHROLITHOTOMY PERCUTANEOUS SECOND LOOK (Left )     Patient location during evaluation: PACU Anesthesia Type: General Level of consciousness: awake and alert Pain management: pain level controlled Vital Signs Assessment: post-procedure vital signs reviewed and stable Respiratory status: spontaneous breathing, nonlabored ventilation, respiratory function stable and patient connected to nasal cannula oxygen Cardiovascular status: blood pressure returned to baseline and stable Postop Assessment: no apparent nausea or vomiting Anesthetic complications: no    Last Vitals:  Vitals:   07/21/17 1500 07/21/17 1703  BP: (!) 103/59 117/67  Pulse: 78 79  Resp: 14   Temp: 36.8 C 36.9 C  SpO2: 95% 97%    Last Pain:  Vitals:   07/21/17 1703  TempSrc: Oral  PainSc:                  [place]

## 2017-07-29 ENCOUNTER — Other Ambulatory Visit: Payer: Self-pay | Admitting: Urology

## 2017-07-29 DIAGNOSIS — Q6239 Other obstructive defects of renal pelvis and ureter: Secondary | ICD-10-CM

## 2017-07-29 DIAGNOSIS — N200 Calculus of kidney: Secondary | ICD-10-CM | POA: Diagnosis not present

## 2017-08-08 MED FILL — SPIRONOLACTONE 25 MG TABLET: 25 | 90 days supply | Qty: 45 | Fill #3

## 2017-08-08 MED FILL — LISINOPRIL 40 MG TABLET: 40 | 90 days supply | Qty: 90 | Fill #3

## 2017-08-12 MED FILL — SHINGRIX VIAL KIT: 50 | 1 days supply | Qty: 1 | Fill #0

## 2017-08-22 MED FILL — VENLAFAXINE HCL ER 75 MG CA: 75 | 90 days supply | Qty: 90 | Fill #2

## 2017-08-22 MED FILL — ATORVASTATIN 40 MG TABLET: 40 | 90 days supply | Qty: 90 | Fill #3

## 2017-08-22 MED FILL — METOPROLOL SUCC ER 100 MG T: 100 | 30 days supply | Qty: 45 | Fill #4

## 2017-09-05 ENCOUNTER — Encounter (HOSPITAL_COMMUNITY)
Admission: RE | Admit: 2017-09-05 | Discharge: 2017-09-05 | Disposition: A | Discharge status: Home / Self Care | Payer: No Typology Code available for payment source | Source: Ambulatory Visit | Attending: Urology | Admitting: Urology

## 2017-09-05 DIAGNOSIS — Q6239 Other obstructive defects of renal pelvis and ureter: Secondary | ICD-10-CM | POA: Diagnosis not present

## 2017-09-05 MED ORDER — FUROSEMIDE 10 MG/ML IJ SOLN
INTRAMUSCULAR | Status: AC
  Filled 2017-09-05: qty 4

## 2017-09-05 MED ORDER — FUROSEMIDE 10 MG/ML IJ SOLN: 39.0000 mg | Freq: Once | INTRAMUSCULAR | Status: DC

## 2017-09-05 MED ORDER — TECHNETIUM TC 99M MERTIATIDE
4.8000 | Freq: Once | INTRAVENOUS | Status: AC | PRN
  Administered 2017-09-05: 4.8 via INTRAVENOUS

## 2017-10-03 MED FILL — PANTOPRAZOLE SOD DR 40 MG T: 40 | 90 days supply | Qty: 90 | Fill #0

## 2017-10-13 MED FILL — SHINGRIX VIAL KIT: 50 | 1 days supply | Qty: 1 | Fill #1

## 2017-11-07 MED FILL — VENLAFAXINE HCL ER 75 MG CA: 75 | 90 days supply | Qty: 90 | Fill #0

## 2017-11-07 MED FILL — SPIRONOLACTONE 25 MG TABLET: 25 | 90 days supply | Qty: 45 | Fill #0

## 2017-11-07 MED FILL — LISINOPRIL 40 MG TABLET: 40 | 90 days supply | Qty: 90 | Fill #0

## 2017-11-21 MED FILL — ATORVASTATIN 40 MG TABLET: 40 | 90 days supply | Qty: 90 | Fill #0

## 2017-11-21 MED FILL — METOPROLOL SUCCINATE ER 100: 100 | 30 days supply | Qty: 45 | Fill #0

## 2018-01-02 MED FILL — PANTOPRAZOLE SOD DR 40 MG T: 40 | 90 days supply | Qty: 90 | Fill #1

## 2018-02-06 MED FILL — SPIRONOLACTONE 25 MG TABLET: 25 | 90 days supply | Qty: 45 | Fill #1

## 2018-02-06 MED FILL — LISINOPRIL 40 MG TABLET: 40 | 90 days supply | Qty: 90 | Fill #1

## 2018-02-09 NOTE — Telephone Encounter (Signed)
Signing past encounter of attempted phone call 

## 2018-02-13 MED FILL — VENLAFAXINE HCL ER 75 MG CA: 75 | 90 days supply | Qty: 90 | Fill #1

## 2018-02-20 MED FILL — METOPROLOL SUCCINATE ER 100: 100 | 30 days supply | Qty: 45 | Fill #1

## 2018-02-20 MED FILL — ATORVASTATIN 40 MG TABLET: 40 | 90 days supply | Qty: 90 | Fill #1

## 2018-03-30 MED FILL — VENLAFAXINE HCL ER 37.5 MG: 37.5 | 30 days supply | Qty: 30 | Fill #0

## 2018-03-30 MED FILL — METOPROLOL SUCCINATE ER 50: 50 | 90 days supply | Qty: 90 | Fill #0

## 2018-03-30 MED FILL — PANTOPRAZOLE SOD DR 40 MG T: 40 | 90 days supply | Qty: 90 | Fill #0

## 2018-05-08 MED FILL — LISINOPRIL 40 MG TABLET: 40 | 90 days supply | Qty: 90 | Fill #2

## 2018-05-08 MED FILL — SPIRONOLACTONE 25 MG TABLET: 25 | 90 days supply | Qty: 45 | Fill #2

## 2018-05-22 MED FILL — ATORVASTATIN 40 MG TABLET: 40 | 90 days supply | Qty: 90 | Fill #0

## 2018-07-10 MED FILL — PANTOPRAZOLE SOD DR 40 MG T: 40 | 90 days supply | Qty: 90 | Fill #1

## 2018-07-31 MED FILL — SPIRONOLACTONE 25 MG TABLET: 25 | 90 days supply | Qty: 45 | Fill #0

## 2018-08-14 MED FILL — LISINOPRIL 40 MG TABLET: 40 | 90 days supply | Qty: 90 | Fill #0

## 2018-08-28 MED FILL — METOPROLOL SUCCINATE ER 50: 50 | 90 days supply | Qty: 90 | Fill #1

## 2018-08-28 MED FILL — ATORVASTATIN 40 MG TABLET: 40 | 90 days supply | Qty: 90 | Fill #1

## 2018-10-10 MED FILL — VENLAFAXINE HCL ER 37.5 MG: 37.5 | 30 days supply | Qty: 30 | Fill #1 | Status: TO

## 2018-10-16 MED FILL — PANTOPRAZOLE SOD DR 40 MG T: 40 | 90 days supply | Qty: 90 | Fill #2

## 2018-10-28 MED FILL — SPIRONOLACTONE 25 MG TABS: 25 | 90 days supply | Qty: 45 | Fill #0 | Status: TO

## 2018-10-28 MED FILL — VENLAFAXINE HCL ER 37.5 MG: 37.5 | 30 days supply | Qty: 30 | Fill #0

## 2018-10-28 MED FILL — LISINOPRIL 40 MG TABLET: 40 | 90 days supply | Qty: 90 | Fill #0 | Status: TO

## 2018-11-27 MED FILL — ATORVASTATIN 40 MG TABLET: 40 | 90 days supply | Qty: 90 | Fill #0 | Status: TO

## 2018-11-27 MED FILL — METOPROLOL SUCCINATE ER 50: 50 | 90 days supply | Qty: 90 | Fill #0

## 2018-11-27 MED FILL — VENLAFAXINE HCL ER 37.5 MG: 37.5 | 30 days supply | Qty: 30 | Fill #1

## 2018-12-26 MED FILL — PANTOPRAZOLE SOD DR 40 MG T: 40 | 90 days supply | Qty: 90 | Fill #0

## 2018-12-26 MED FILL — VENLAFAXINE HCL ER 37.5 MG: 37.5 | 30 days supply | Qty: 30 | Fill #0

## 2018-12-27 ENCOUNTER — Telehealth: Payer: Self-pay

## 2018-12-27 NOTE — Telephone Encounter (Signed)
Left message for patient to call back regarding scheduling  appointment due to restrictions enacted for Covid 19.

## 2019-01-03 MED FILL — LISINOPRIL 40 MG TABLET: 40 | 90 days supply | Qty: 90 | Fill #0

## 2019-01-03 MED FILL — SPIRONOLACTONE 25 MG TABLET: 25 | 90 days supply | Qty: 45 | Fill #0

## 2019-01-23 IMAGING — XA DG C-ARM 61-120 MIN-NO REPORT
11 series · 14 of 14 positions shown · non-contrast
Comparison: none

[Series 1: uro standard · 1 of 1 slices shown (1 of 11)]
[im 1/1]
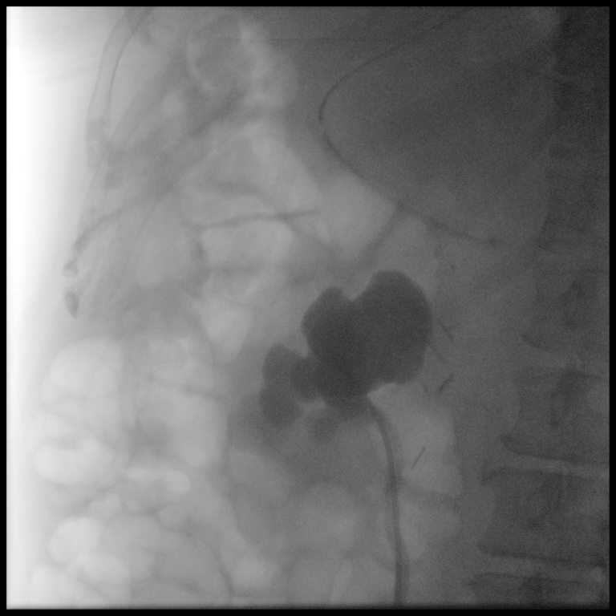

[Series 2: uro standard · 1 of 1 slices shown (2 of 11)]
[im 1/1]
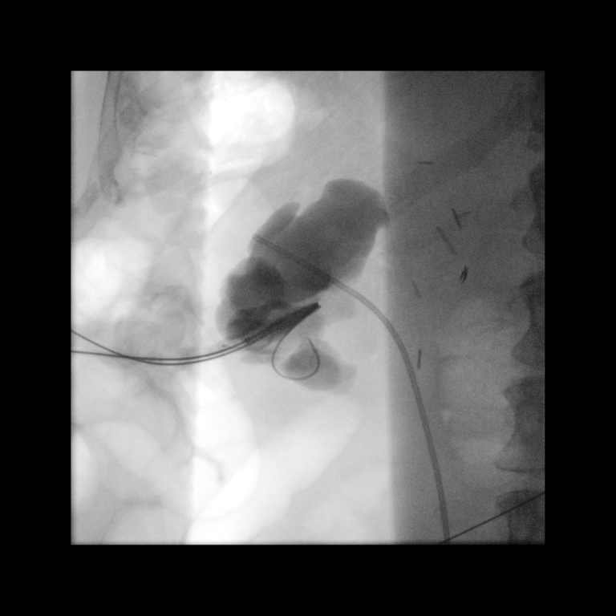

[Series 3: uro standard · 1 of 1 slices shown (3 of 11)]
[im 1/1]
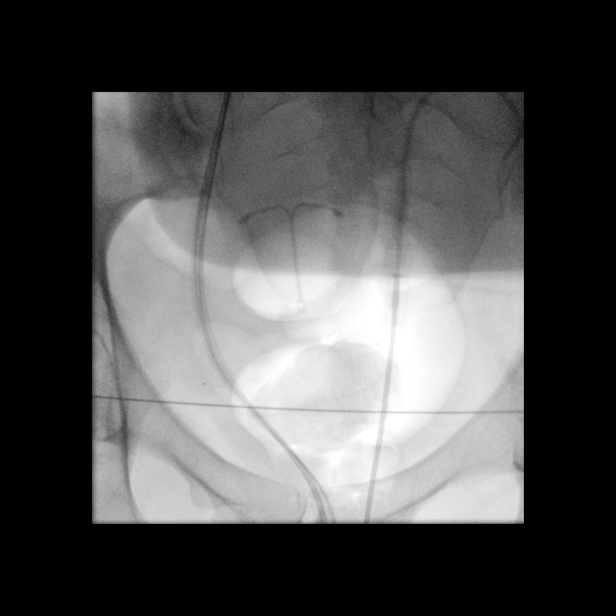

[Series 4: uro standard · 1 of 1 slices shown (4 of 11)]
[im 1/1]
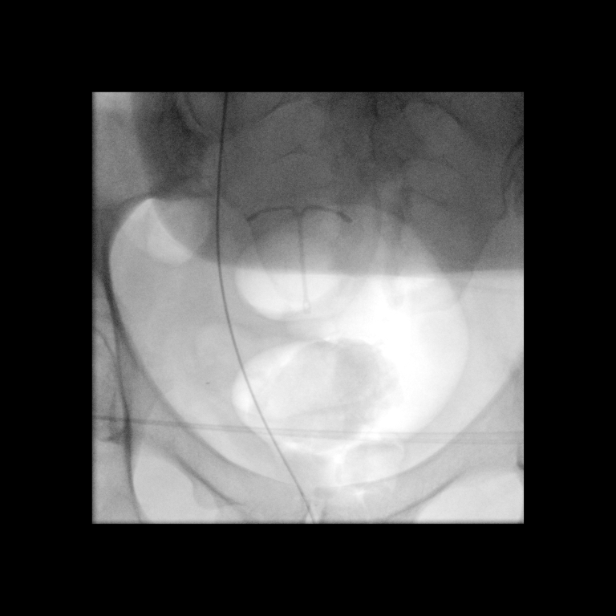

[Series 5: uro standard · 1 of 1 slices shown (5 of 11)]
[im 1/1]
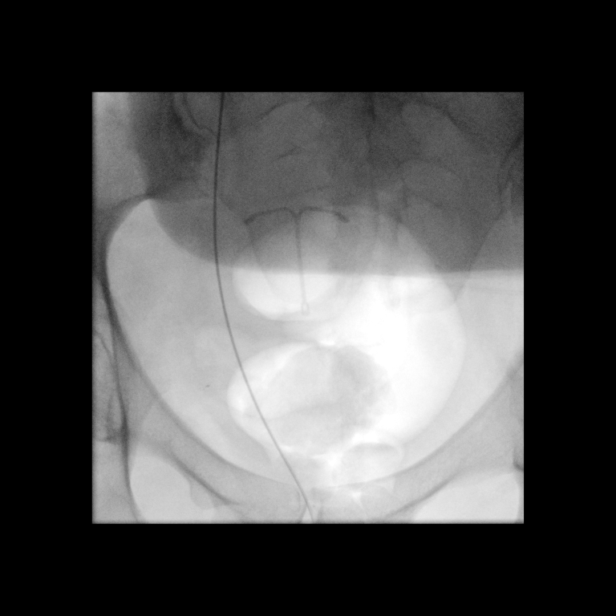

[Series 6: uro standard · 1 of 1 slices shown (6 of 11)]
[im 1/1]
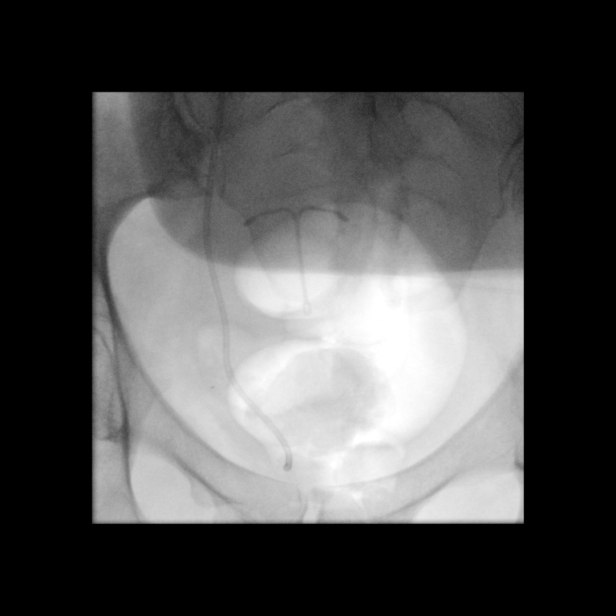

[Series 7: uro standard · 2 of 2 slices shown (7 of 11)]
[im 1/2]
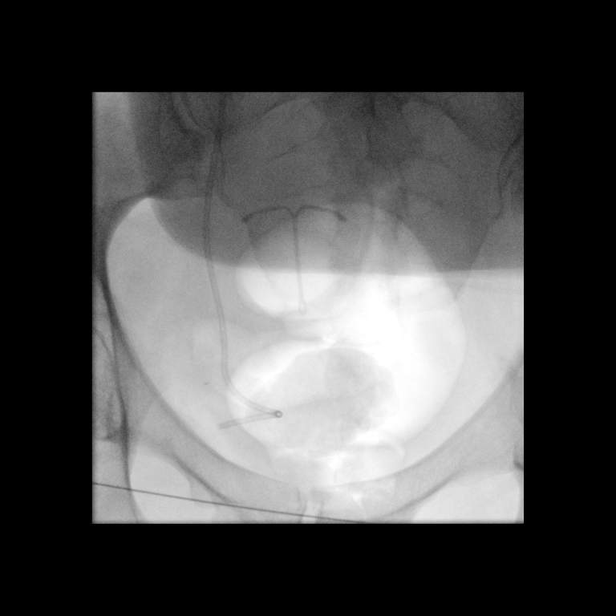
[im 2/2]
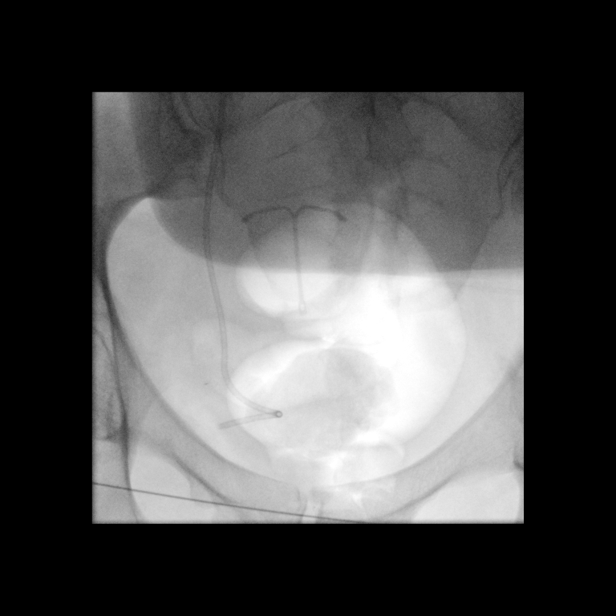

[Series 9: uro standard · 1 of 1 slices shown (8 of 11)]
[im 1/1]
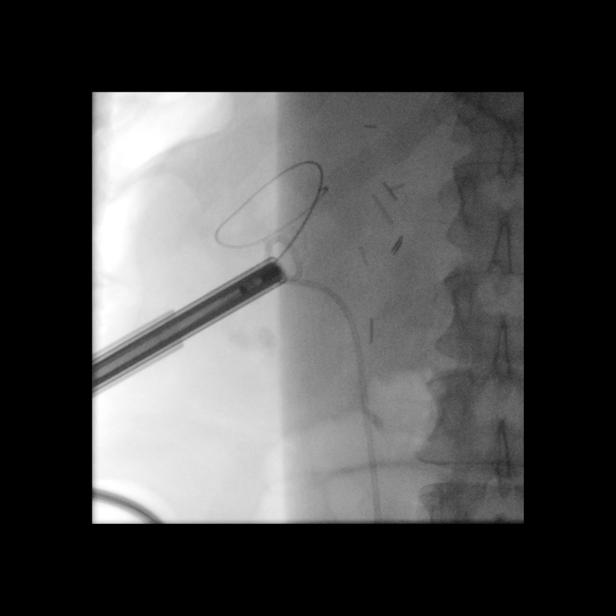

[Series 10: uro standard · 1 of 1 slices shown (9 of 11)]
[im 1/1]
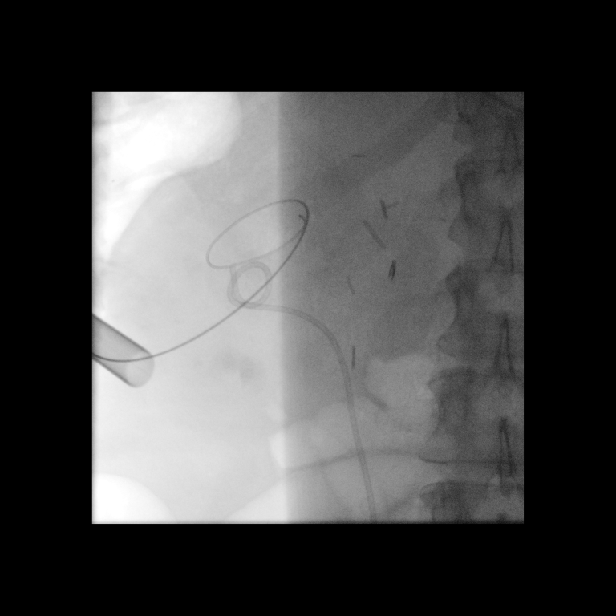

[Series 11: uro standard · 1 of 1 slices shown (10 of 11)]
[im 1/1]
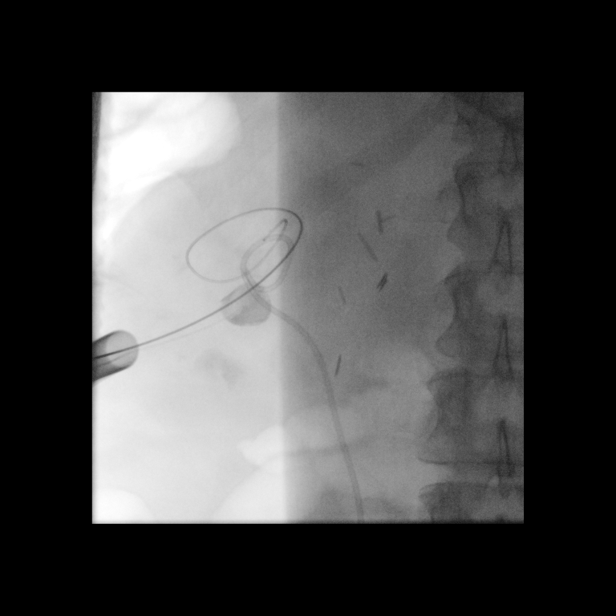

[Series 12: uro standard · 3 of 3 slices shown (11 of 11)]
[im 1/3]
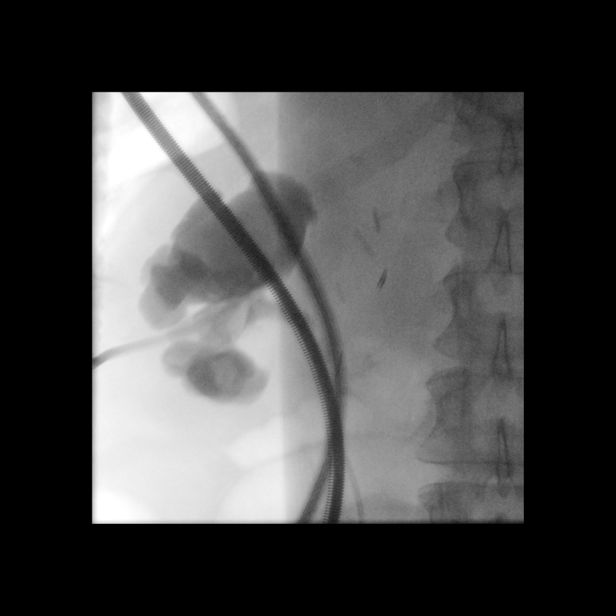
[im 2/3]
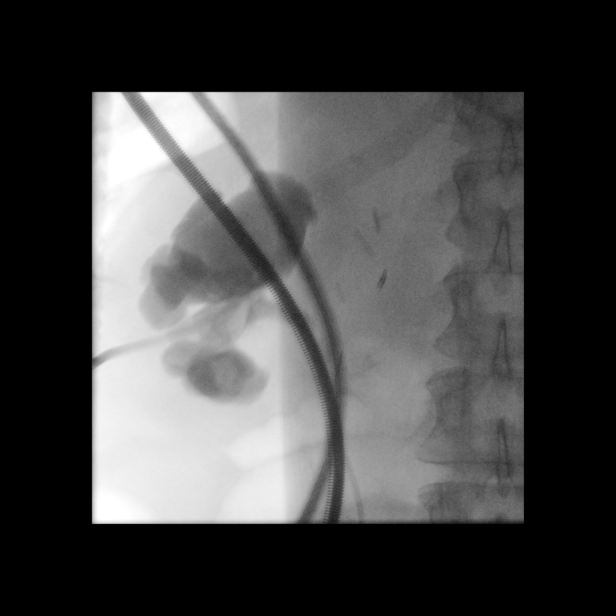
[im 3/3]
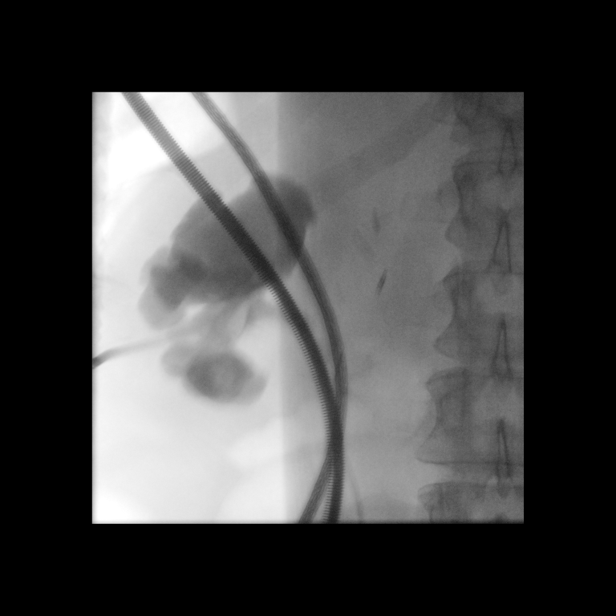

[14 of 14 positions shown; findings below may reference images not displayed]

:
Fluoroscopy was utilized by the requesting physician. No
radiographic interpretation.

## 2019-02-06 MED FILL — VENLAFAXINE HCL ER 37.5 MG: 37.5 | 30 days supply | Qty: 30 | Fill #1

## 2019-03-06 MED FILL — ATORVASTATIN 40 MG TABLET: 40 | 90 days supply | Qty: 90 | Fill #0

## 2019-03-06 MED FILL — VENLAFAXINE HCL ER 37.5 MG: 37.5 | 30 days supply | Qty: 30 | Fill #2

## 2019-03-06 MED FILL — METOPROLOL SUCCINATE ER 50: 50 | 90 days supply | Qty: 90 | Fill #0

## 2019-04-03 MED FILL — VENLAFAXINE HCL ER 37.5 MG: 37.5 | 30 days supply | Qty: 30 | Fill #3

## 2019-04-26 MED FILL — SPIRONOLACTONE 25 MG TABLET: 25 | 90 days supply | Qty: 45 | Fill #0

## 2019-04-26 MED FILL — PANTOPRAZOLE SOD DR 40 MG T: 40 | 90 days supply | Qty: 90 | Fill #1

## 2019-05-04 MED FILL — VENLAFAXINE HCL ER 37.5 MG: 37.5 | 30 days supply | Qty: 30 | Fill #4

## 2019-05-08 MED FILL — LISINOPRIL 40 MG TABLET: 40 | 90 days supply | Qty: 90 | Fill #0

## 2019-05-17 MED FILL — ATORVASTATIN 40 MG TABLET: 40 | 90 days supply | Qty: 90 | Fill #0

## 2019-05-17 MED FILL — METOPROLOL SUCCINATE ER 50: 50 | 90 days supply | Qty: 90 | Fill #0

## 2019-06-04 MED FILL — VENLAFAXINE HCL ER 37.5 MG: 37.5 | 30 days supply | Qty: 30 | Fill #5

## 2019-06-11 MED FILL — METOPROLOL SUCCINATE ER 50: 50 | 90 days supply | Qty: 90 | Fill #1

## 2019-06-11 MED FILL — ATORVASTATIN 40 MG TABLET: 40 | 90 days supply | Qty: 90 | Fill #1

## 2019-07-06 MED FILL — VENLAFAXINE HCL ER 37.5 MG: 37.5 | 30 days supply | Qty: 30 | Fill #6

## 2019-07-30 MED FILL — VENLAFAXINE HCL ER 37.5 MG: 37.5 | 90 days supply | Qty: 90 | Fill #0

## 2019-07-30 MED FILL — spIRONOLACTONE 25 MG TABS: 25 | 90 days supply | Qty: 45 | Fill #1

## 2019-07-30 MED FILL — PANTOPRAZOLE SOD DR 40 MG T: 40 | 90 days supply | Qty: 90 | Fill #2

## 2019-08-20 MED FILL — LISINOPRIL 40 MG TABLET: 40 | 90 days supply | Qty: 90 | Fill #1

## 2019-09-17 MED FILL — METOPROLOL SUCCINATE ER 50: 50 | 90 days supply | Qty: 90 | Fill #2

## 2019-09-17 MED FILL — ATORVASTATIN 40 MG TABLET: 40 | 90 days supply | Qty: 90 | Fill #2

## 2019-10-29 MED FILL — VENLAFAXINE HCL ER 37.5 MG: 37.5 | 90 days supply | Qty: 90 | Fill #1

## 2019-10-29 MED FILL — SPIRONOLACTONE 25 MG TABS: 25 | 90 days supply | Qty: 45 | Fill #0

## 2019-10-29 MED FILL — PANTOPRAZOLE SOD DR 40 MG T: 40 | 90 days supply | Qty: 90 | Fill #0

## 2019-12-03 MED FILL — LISINOPRIL 40 MG TABS: 40 | 90 days supply | Qty: 90 | Fill #2

## 2019-12-17 MED FILL — ATORVASTATIN 40 MG TABLET: 40 | 90 days supply | Qty: 90 | Fill #0

## 2019-12-17 MED FILL — METOPROLOL SUCCINATE ER 50: 50 | 90 days supply | Qty: 90 | Fill #0

## 2020-01-28 MED FILL — SPIRONOLACTONE 25 MG TABS: 25 | 90 days supply | Qty: 45 | Fill #1

## 2020-01-28 MED FILL — PANTOPRAZOLE SOD DR 40 MG T: 40 | 90 days supply | Qty: 90 | Fill #1

## 2020-01-28 MED FILL — VENLAFAXINE HCL ER 37.5 MG: 37.5 | 90 days supply | Qty: 90 | Fill #2

## 2020-03-03 MED FILL — LISINOPRIL 40 MG TABS: 40 | 90 days supply | Qty: 90 | Fill #0

## 2020-03-17 MED FILL — METOPROLOL SUCCINATE ER 50: 50 | 90 days supply | Qty: 90 | Fill #1

## 2020-03-24 MED FILL — ATORVASTATIN 40 MG TABLET: 40 | 90 days supply | Qty: 90 | Fill #1

## 2020-04-28 ENCOUNTER — Other Ambulatory Visit (HOSPITAL_COMMUNITY): Payer: Self-pay | Admitting: Family Medicine

## 2020-04-28 MED FILL — VENLAFAXINE HCL ER 37.5 MG: 37.5 | 90 days supply | Qty: 90 | Fill #0

## 2020-05-05 MED FILL — PANTOPRAZOLE SOD DR 40 MG T: 40 | 90 days supply | Qty: 90 | Fill #2

## 2020-05-05 MED FILL — SPIRONOLACTONE 25 MG TABS: 25 | 90 days supply | Qty: 45 | Fill #2

## 2020-05-08 ENCOUNTER — Other Ambulatory Visit (HOSPITAL_COMMUNITY): Payer: Self-pay | Admitting: Family Medicine

## 2020-06-02 MED FILL — LISINOPRIL 40 MG TABS: 40 | 90 days supply | Qty: 90 | Fill #0

## 2020-06-23 MED FILL — ATORVASTATIN 40 MG TABLET: 40 | 90 days supply | Qty: 90 | Fill #0

## 2020-06-23 MED FILL — METOPROLOL SUCCINATE ER 50: 50 | 90 days supply | Qty: 90 | Fill #0

## 2020-07-21 MED FILL — VENLAFAXINE HCL ER 37.5 MG: 37.5 | 90 days supply | Qty: 90 | Fill #1

## 2020-08-04 MED FILL — SPIRONOLACTONE 25 MG TABS: 25 | 90 days supply | Qty: 45 | Fill #0

## 2020-08-04 MED FILL — PANTOPRAZOLE SOD DR 40 MG T: 40 | 90 days supply | Qty: 90 | Fill #0

## 2020-09-01 MED FILL — LISINOPRIL 40 MG TABS: 40 | 90 days supply | Qty: 90 | Fill #1

## 2020-09-22 MED FILL — METOPROLOL SUCCINATE ER 50: 50 | 90 days supply | Qty: 90 | Fill #1

## 2020-09-22 MED FILL — ATORVASTATIN 40 MG TABLET: 40 | 90 days supply | Qty: 90 | Fill #1

## 2020-10-27 MED FILL — VENLAFAXINE HCL ER 37.5 MG: 37.5 | 90 days supply | Qty: 90 | Fill #2

## 2020-11-06 ENCOUNTER — Other Ambulatory Visit (HOSPITAL_COMMUNITY): Payer: Self-pay

## 2020-11-06 MED ORDER — LISINOPRIL 40 MG PO TABS
40.0000 mg | ORAL_TABLET | Freq: Every day | ORAL | 2 refills | Status: DC
  Filled 2020-11-06: qty 90, 90d supply, fill #0

## 2020-11-06 MED ORDER — SPIRONOLACTONE 25 MG PO TABS
12.5000 mg | ORAL_TABLET | Freq: Every day | ORAL | 2 refills | Status: DC
  Filled 2020-11-06: qty 45, 90d supply, fill #0
  Filled 2021-02-01: qty 45, 90d supply, fill #1

## 2020-11-06 MED ORDER — VENLAFAXINE HCL ER 37.5 MG PO CP24
37.5000 mg | ORAL_CAPSULE | Freq: Every day | ORAL | 2 refills | Status: DC
  Filled 2020-11-06 – 2021-01-21 (×2): qty 90, 90d supply, fill #0
  Filled 2021-04-19: qty 90, 90d supply, fill #1

## 2020-11-06 MED ORDER — PANTOPRAZOLE SODIUM 40 MG PO TBEC
40.0000 mg | DELAYED_RELEASE_TABLET | Freq: Every day | ORAL | 2 refills | Status: DC
  Filled 2020-11-06 (×2): qty 90, 90d supply, fill #0
  Filled 2021-02-01: qty 90, 90d supply, fill #1

## 2020-11-06 MED ORDER — METOPROLOL SUCCINATE ER 50 MG PO TB24
50.0000 mg | ORAL_TABLET | Freq: Every day | ORAL | 2 refills | Status: DC
  Filled 2020-11-06 – 2021-03-23 (×2): qty 90, 90d supply, fill #0

## 2020-11-06 MED ORDER — ATORVASTATIN CALCIUM 40 MG PO TABS
40.0000 mg | ORAL_TABLET | Freq: Every day | ORAL | 2 refills | Status: DC
  Filled 2020-11-06 – 2021-03-23 (×2): qty 90, 90d supply, fill #0

## 2020-11-09 MED FILL — Pantoprazole Sodium EC Tab 40 MG (Base Equiv): ORAL | 90 days supply | Qty: 90 | Fill #0 | Status: CN

## 2020-11-09 MED FILL — Spironolactone Tab 25 MG: ORAL | 90 days supply | Qty: 45 | Fill #0 | Status: CN

## 2020-11-10 ENCOUNTER — Other Ambulatory Visit (HOSPITAL_COMMUNITY): Payer: Self-pay

## 2020-11-12 ENCOUNTER — Other Ambulatory Visit (HOSPITAL_COMMUNITY): Payer: Self-pay

## 2020-11-30 MED FILL — Lisinopril Tab 40 MG: ORAL | 90 days supply | Qty: 90 | Fill #0 | Status: AC

## 2020-12-01 ENCOUNTER — Other Ambulatory Visit (HOSPITAL_COMMUNITY): Payer: Self-pay

## 2020-12-21 MED FILL — Atorvastatin Calcium Tab 40 MG (Base Equivalent): ORAL | 90 days supply | Qty: 90 | Fill #0 | Status: AC

## 2020-12-21 MED FILL — Metoprolol Succinate Tab ER 24HR 50 MG (Tartrate Equiv): ORAL | 90 days supply | Qty: 90 | Fill #0 | Status: AC

## 2020-12-22 ENCOUNTER — Other Ambulatory Visit (HOSPITAL_COMMUNITY): Payer: Self-pay

## 2021-01-01 ENCOUNTER — Other Ambulatory Visit (HOSPITAL_COMMUNITY): Payer: Self-pay

## 2021-01-01 MED ORDER — CARESTART COVID-19 HOME TEST VI KIT
PACK | 0 refills | Status: DC
  Filled 2021-01-01: qty 4, 4d supply, fill #0

## 2021-01-18 ENCOUNTER — Other Ambulatory Visit (HOSPITAL_COMMUNITY): Payer: Self-pay

## 2021-01-21 ENCOUNTER — Other Ambulatory Visit (HOSPITAL_COMMUNITY): Payer: Self-pay

## 2021-02-03 ENCOUNTER — Other Ambulatory Visit (HOSPITAL_COMMUNITY): Payer: Self-pay

## 2021-02-18 ENCOUNTER — Other Ambulatory Visit (HOSPITAL_COMMUNITY): Payer: Self-pay

## 2021-02-18 MED ORDER — CARESTART COVID-19 HOME TEST VI KIT
PACK | 0 refills | Status: DC
  Filled 2021-02-18: qty 4, 4d supply, fill #0

## 2021-02-23 ENCOUNTER — Other Ambulatory Visit (HOSPITAL_COMMUNITY): Payer: Self-pay

## 2021-02-23 MED ORDER — CARESTART COVID-19 HOME TEST VI KIT
PACK | 0 refills | Status: DC
  Filled 2021-02-23: qty 4, 4d supply, fill #0

## 2021-03-01 ENCOUNTER — Other Ambulatory Visit (HOSPITAL_COMMUNITY): Payer: Self-pay

## 2021-03-03 ENCOUNTER — Other Ambulatory Visit (HOSPITAL_COMMUNITY): Payer: Self-pay

## 2021-03-05 ENCOUNTER — Other Ambulatory Visit (HOSPITAL_COMMUNITY): Payer: Self-pay

## 2021-03-05 MED ORDER — LISINOPRIL 40 MG PO TABS
40.0000 mg | ORAL_TABLET | Freq: Every day | ORAL | 1 refills | Status: DC
  Filled 2021-03-05: qty 90, 90d supply, fill #0
  Filled 2021-06-07: qty 90, 90d supply, fill #1

## 2021-03-05 MED ORDER — SULFAMETHOXAZOLE-TRIMETHOPRIM 800-160 MG PO TABS
1.0000 | ORAL_TABLET | Freq: Two times a day (BID) | ORAL | 0 refills | Status: AC
  Filled 2021-03-05: qty 10, 5d supply, fill #0

## 2021-03-13 ENCOUNTER — Other Ambulatory Visit: Payer: Self-pay | Admitting: Family Medicine

## 2021-03-13 ENCOUNTER — Ambulatory Visit (HOSPITAL_BASED_OUTPATIENT_CLINIC_OR_DEPARTMENT_OTHER)
Admission: RE | Admit: 2021-03-13 | Discharge: 2021-03-13 | Disposition: A | Discharge status: Home / Self Care | Payer: No Typology Code available for payment source | Source: Ambulatory Visit | Attending: Family Medicine | Admitting: Family Medicine

## 2021-03-13 ENCOUNTER — Other Ambulatory Visit: Payer: Self-pay

## 2021-03-13 ENCOUNTER — Other Ambulatory Visit (HOSPITAL_BASED_OUTPATIENT_CLINIC_OR_DEPARTMENT_OTHER): Payer: Self-pay | Admitting: Family Medicine

## 2021-03-13 ENCOUNTER — Other Ambulatory Visit (HOSPITAL_COMMUNITY): Payer: Self-pay | Admitting: Family Medicine

## 2021-03-13 DIAGNOSIS — R319 Hematuria, unspecified: Secondary | ICD-10-CM | POA: Diagnosis present

## 2021-03-13 DIAGNOSIS — R109 Unspecified abdominal pain: Secondary | ICD-10-CM | POA: Insufficient documentation

## 2021-03-13 LAB — POCT I-STAT CREATININE
Creatinine, Ser: 0.7 mg/dL (ref 0.44–1.00)
Creatinine, Ser: <0.2 mg/dL — ABNORMAL LOW (ref 0.44–1.00)

## 2021-03-13 MED ORDER — IOHEXOL 350 MG/ML SOLN
80.0000 mL | Freq: Once | INTRAVENOUS | Status: AC | PRN
  Administered 2021-03-13: 80 mL via INTRAVENOUS

## 2021-03-22 ENCOUNTER — Other Ambulatory Visit (HOSPITAL_COMMUNITY): Payer: Self-pay

## 2021-03-23 ENCOUNTER — Other Ambulatory Visit (HOSPITAL_COMMUNITY): Payer: Self-pay

## 2021-03-26 ENCOUNTER — Other Ambulatory Visit: Payer: Self-pay

## 2021-03-26 ENCOUNTER — Encounter (HOSPITAL_BASED_OUTPATIENT_CLINIC_OR_DEPARTMENT_OTHER): Payer: Self-pay | Admitting: Urology

## 2021-03-26 ENCOUNTER — Other Ambulatory Visit: Payer: Self-pay | Admitting: Urology

## 2021-03-26 DIAGNOSIS — Z973 Presence of spectacles and contact lenses: Secondary | ICD-10-CM

## 2021-03-26 HISTORY — DX: Presence of spectacles and contact lenses: Z97.3

## 2021-03-26 NOTE — Progress Notes (Signed)
Spoke w/ via phone for pre-op interview---pt Lab needs dos----  I stat, ekg             Lab results------see below COVID test -----patient states asymptomatic no test needed Arrive at -------700 am 03-31-2021 NPO after MN NO Solid Food.  Clear liquids from MN until---600 am Med rec completed Medications to take morning of surgery -----venlafaxine,  Diabetic medication -----n/a Patient instructed no nail polish to be worn day of surgery Patient instructed to bring photo id and insurance card day of surgery Patient aware to have Driver (ride ) / caregiver     sister or son for 24 hours after surgery  Patient Special Instructions -----none Pre-Op special Istructions -----none Patient verbalized understanding of instructions that were given at this phone interview. Patient denies shortness of breath, chest pain, fever, cough at this phone interview.   Lov neurlology dr a Tomi Likens 01-03-2017 f/u prn epic Echo 01-30-2016 epic Mri head 07-30-2016 epic Lov dr Marlou Porch 12-03-2015 cardiology saw for elevated blood pressure

## 2021-03-31 ENCOUNTER — Encounter (HOSPITAL_BASED_OUTPATIENT_CLINIC_OR_DEPARTMENT_OTHER)
Admission: RE | Disposition: A | Discharge status: Home / Self Care | Payer: Self-pay | Source: Home / Self Care | Attending: Urology

## 2021-03-31 ENCOUNTER — Encounter (HOSPITAL_BASED_OUTPATIENT_CLINIC_OR_DEPARTMENT_OTHER): Payer: Self-pay | Admitting: Urology

## 2021-03-31 ENCOUNTER — Ambulatory Visit (HOSPITAL_BASED_OUTPATIENT_CLINIC_OR_DEPARTMENT_OTHER): Payer: No Typology Code available for payment source | Admitting: Anesthesiology

## 2021-03-31 ENCOUNTER — Other Ambulatory Visit: Payer: Self-pay

## 2021-03-31 ENCOUNTER — Ambulatory Visit (HOSPITAL_BASED_OUTPATIENT_CLINIC_OR_DEPARTMENT_OTHER)
Admission: RE | Admit: 2021-03-31 | Discharge: 2021-03-31 | Disposition: A | Discharge status: Home / Self Care | Payer: No Typology Code available for payment source | Attending: Urology | Admitting: Urology

## 2021-03-31 ENCOUNTER — Other Ambulatory Visit (HOSPITAL_COMMUNITY): Payer: Self-pay

## 2021-03-31 ENCOUNTER — Ambulatory Visit (HOSPITAL_COMMUNITY): Payer: No Typology Code available for payment source

## 2021-03-31 DIAGNOSIS — Z87442 Personal history of urinary calculi: Secondary | ICD-10-CM | POA: Diagnosis not present

## 2021-03-31 DIAGNOSIS — R1084 Generalized abdominal pain: Secondary | ICD-10-CM | POA: Diagnosis not present

## 2021-03-31 DIAGNOSIS — Z87891 Personal history of nicotine dependence: Secondary | ICD-10-CM | POA: Insufficient documentation

## 2021-03-31 DIAGNOSIS — Z85828 Personal history of other malignant neoplasm of skin: Secondary | ICD-10-CM | POA: Diagnosis not present

## 2021-03-31 DIAGNOSIS — N135 Crossing vessel and stricture of ureter without hydronephrosis: Secondary | ICD-10-CM | POA: Insufficient documentation

## 2021-03-31 DIAGNOSIS — Z7982 Long term (current) use of aspirin: Secondary | ICD-10-CM | POA: Diagnosis not present

## 2021-03-31 DIAGNOSIS — Z8673 Personal history of transient ischemic attack (TIA), and cerebral infarction without residual deficits: Secondary | ICD-10-CM | POA: Insufficient documentation

## 2021-03-31 DIAGNOSIS — N200 Calculus of kidney: Secondary | ICD-10-CM | POA: Insufficient documentation

## 2021-03-31 DIAGNOSIS — Z79899 Other long term (current) drug therapy: Secondary | ICD-10-CM | POA: Insufficient documentation

## 2021-03-31 HISTORY — PX: HOLMIUM LASER APPLICATION: SHX5852

## 2021-03-31 HISTORY — PX: CYSTOSCOPY WITH RETROGRADE PYELOGRAM, URETEROSCOPY AND STENT PLACEMENT: SHX5789

## 2021-03-31 LAB — POCT I-STAT, CHEM 8
BUN: 12 mg/dL (ref 6–20)
Calcium, Ion: 1.3 mmol/L (ref 1.15–1.40)
Chloride: 103 mmol/L (ref 98–111)
Creatinine, Ser: 0.7 mg/dL (ref 0.44–1.00)
Glucose, Bld: 111 mg/dL — ABNORMAL HIGH (ref 70–99)
HCT: 34 % — ABNORMAL LOW (ref 36.0–46.0)
Hemoglobin: 11.6 g/dL — ABNORMAL LOW (ref 12.0–15.0)
Potassium: 3.7 mmol/L (ref 3.5–5.1)
Sodium: 143 mmol/L (ref 135–145)
TCO2: 28 mmol/L (ref 22–32)

## 2021-03-31 SURGERY — CYSTOURETEROSCOPY, WITH RETROGRADE PYELOGRAM AND STENT INSERTION
Anesthesia: General | Site: Renal | Laterality: Left

## 2021-03-31 MED ORDER — EPHEDRINE SULFATE-NACL 50-0.9 MG/10ML-% IV SOSY
PREFILLED_SYRINGE | INTRAVENOUS | Status: DC | PRN
Start: 2021-03-31 — End: 2021-03-31
  Administered 2021-03-31: 5 mg via INTRAVENOUS

## 2021-03-31 MED ORDER — TAMSULOSIN HCL 0.4 MG PO CAPS
0.4000 mg | ORAL_CAPSULE | Freq: Every day | ORAL | 0 refills | Status: DC
  Filled 2021-03-31: qty 30, 30d supply, fill #0

## 2021-03-31 MED ORDER — PHENAZOPYRIDINE HCL 200 MG PO TABS
200.0000 mg | ORAL_TABLET | Freq: Three times a day (TID) | ORAL | 0 refills | Status: AC | PRN
  Filled 2021-03-31: qty 10, 4d supply, fill #0

## 2021-03-31 MED ORDER — 0.9 % SODIUM CHLORIDE (POUR BTL) OPTIME
TOPICAL | Status: DC | PRN
  Administered 2021-03-31: 500 mL

## 2021-03-31 MED ORDER — DEXAMETHASONE SODIUM PHOSPHATE 10 MG/ML IJ SOLN
INTRAMUSCULAR | Status: DC | PRN
  Administered 2021-03-31: 10 mg via INTRAVENOUS

## 2021-03-31 MED ORDER — TRAMADOL HCL 50 MG PO TABS
50.0000 mg | ORAL_TABLET | Freq: Four times a day (QID) | ORAL | 0 refills | Status: AC | PRN
  Filled 2021-03-31: qty 20, 5d supply, fill #0

## 2021-03-31 MED ORDER — ONDANSETRON HCL 4 MG/2ML IJ SOLN
INTRAMUSCULAR | Status: AC
  Filled 2021-03-31: qty 2

## 2021-03-31 MED ORDER — LACTATED RINGERS IV SOLN: INTRAVENOUS | Status: DC

## 2021-03-31 MED ORDER — FENTANYL CITRATE (PF) 100 MCG/2ML IJ SOLN
INTRAMUSCULAR | Status: DC | PRN
  Administered 2021-03-31: 50 ug via INTRAVENOUS
  Administered 2021-03-31 (×2): 25 ug via INTRAVENOUS

## 2021-03-31 MED ORDER — FENTANYL CITRATE (PF) 100 MCG/2ML IJ SOLN
INTRAMUSCULAR | Status: AC
  Filled 2021-03-31: qty 2

## 2021-03-31 MED ORDER — OXYCODONE HCL 5 MG PO TABS: 5.0000 mg | ORAL_TABLET | Freq: Once | ORAL | Status: DC | PRN

## 2021-03-31 MED ORDER — MIDAZOLAM HCL 2 MG/2ML IJ SOLN
INTRAMUSCULAR | Status: AC
  Filled 2021-03-31: qty 2

## 2021-03-31 MED ORDER — CEPHALEXIN 500 MG PO CAPS
500.0000 mg | ORAL_CAPSULE | Freq: Once | ORAL | 0 refills | Status: AC
  Filled 2021-03-31: qty 1, 1d supply, fill #0

## 2021-03-31 MED ORDER — SODIUM CHLORIDE 0.9 % IR SOLN
Status: DC | PRN
  Administered 2021-03-31 (×2): 3000 mL

## 2021-03-31 MED ORDER — PROPOFOL 10 MG/ML IV BOLUS
INTRAVENOUS | Status: DC | PRN
  Administered 2021-03-31: 160 mg via INTRAVENOUS

## 2021-03-31 MED ORDER — OXYCODONE HCL 5 MG/5ML PO SOLN: 5.0000 mg | Freq: Once | ORAL | Status: DC | PRN

## 2021-03-31 MED ORDER — LIDOCAINE 2% (20 MG/ML) 5 ML SYRINGE
INTRAMUSCULAR | Status: DC | PRN
  Administered 2021-03-31: 80 mg via INTRAVENOUS

## 2021-03-31 MED ORDER — MIDAZOLAM HCL 5 MG/5ML IJ SOLN
INTRAMUSCULAR | Status: DC | PRN
  Administered 2021-03-31: 2 mg via INTRAVENOUS

## 2021-03-31 MED ORDER — FENTANYL CITRATE (PF) 100 MCG/2ML IJ SOLN: 25.0000 ug | INTRAMUSCULAR | Status: DC | PRN

## 2021-03-31 MED ORDER — ONDANSETRON HCL 4 MG/2ML IJ SOLN: 4.0000 mg | Freq: Once | INTRAMUSCULAR | Status: DC | PRN

## 2021-03-31 MED ORDER — LIDOCAINE HCL (PF) 2 % IJ SOLN
INTRAMUSCULAR | Status: AC
  Filled 2021-03-31: qty 5

## 2021-03-31 MED ORDER — DEXAMETHASONE SODIUM PHOSPHATE 10 MG/ML IJ SOLN
INTRAMUSCULAR | Status: AC
  Filled 2021-03-31: qty 1

## 2021-03-31 MED ORDER — CEFAZOLIN SODIUM-DEXTROSE 2-4 GM/100ML-% IV SOLN
INTRAVENOUS | Status: AC
  Filled 2021-03-31: qty 100

## 2021-03-31 MED ORDER — KETOROLAC TROMETHAMINE 30 MG/ML IJ SOLN
INTRAMUSCULAR | Status: AC
  Filled 2021-03-31: qty 1

## 2021-03-31 MED ORDER — CEFAZOLIN SODIUM-DEXTROSE 2-4 GM/100ML-% IV SOLN
2.0000 g | INTRAVENOUS | Status: AC
  Administered 2021-03-31: 2 g via INTRAVENOUS

## 2021-03-31 MED ORDER — PROPOFOL 10 MG/ML IV BOLUS
INTRAVENOUS | Status: AC
  Filled 2021-03-31: qty 20

## 2021-03-31 MED ORDER — KETOROLAC TROMETHAMINE 30 MG/ML IJ SOLN
INTRAMUSCULAR | Status: DC | PRN
  Administered 2021-03-31: 30 mg via INTRAVENOUS

## 2021-03-31 MED ORDER — AMISULPRIDE (ANTIEMETIC) 5 MG/2ML IV SOLN: 10.0000 mg | Freq: Once | INTRAVENOUS | Status: DC | PRN

## 2021-03-31 MED ORDER — IOHEXOL 300 MG/ML  SOLN
INTRAMUSCULAR | Status: DC | PRN
  Administered 2021-03-31: 10 mL

## 2021-03-31 MED ORDER — ONDANSETRON HCL 4 MG/2ML IJ SOLN
INTRAMUSCULAR | Status: DC | PRN
  Administered 2021-03-31: 4 mg via INTRAVENOUS

## 2021-03-31 SURGICAL SUPPLY — 25 items
BAG DRAIN URO-CYSTO SKYTR STRL (DRAIN) ×2 IMPLANT
BAG DRN UROCATH (DRAIN) ×1
BASKET ZERO TIP NITINOL 2.4FR (BASKET) ×2 IMPLANT
BSKT STON RTRVL ZERO TP 2.4FR (BASKET) ×1
CATH URET 5FR 28IN OPEN ENDED (CATHETERS) ×2 IMPLANT
CLOTH BEACON ORANGE TIMEOUT ST (SAFETY) ×2 IMPLANT
COVER DOME SNAP 22 D (MISCELLANEOUS) ×2 IMPLANT
DRSG TEGADERM 4X4.75 (GAUZE/BANDAGES/DRESSINGS) IMPLANT
EXTRACTOR STONE 1.7FRX115CM (UROLOGICAL SUPPLIES) ×2 IMPLANT
GLOVE SURG ENC MOIS LTX SZ6.5 (GLOVE) ×2 IMPLANT
GLOVE SURG NEOP MICRO LF SZ6.5 (GLOVE) ×2 IMPLANT
GOWN STRL REUS W/ TWL LRG LVL3 (GOWN DISPOSABLE) ×1 IMPLANT
GOWN STRL REUS W/TWL LRG LVL3 (GOWN DISPOSABLE) ×4 IMPLANT
GUIDEWIRE STR DUAL SENSOR (WIRE) ×4 IMPLANT
IV NS IRRIG 3000ML ARTHROMATIC (IV SOLUTION) ×4 IMPLANT
KIT TURNOVER CYSTO (KITS) ×2 IMPLANT
MANIFOLD NEPTUNE II (INSTRUMENTS) ×2 IMPLANT
NS IRRIG 500ML POUR BTL (IV SOLUTION) ×2 IMPLANT
PACK CYSTO (CUSTOM PROCEDURE TRAY) ×2 IMPLANT
SHEATH URETERAL 12FRX28CM (UROLOGICAL SUPPLIES) ×2 IMPLANT
STENT URET 6FRX24 CONTOUR (STENTS) ×2 IMPLANT
TRACTIP FLEXIVA PULS ID 200XHI (Laser) ×1 IMPLANT
TRACTIP FLEXIVA PULSE ID 200 (Laser) ×2
TUBE CONNECTING 12X1/4 (SUCTIONS) ×2 IMPLANT
TUBING UROLOGY SET (TUBING) ×2 IMPLANT

## 2021-03-31 NOTE — Transfer of Care (Signed)
Immediate Anesthesia Transfer of Care Note  Patient: Deborah Thompson  Procedure(s) Performed: CYSTOSCOPY WITH RETROGRADE PYELOGRAM, URETEROSCOPY, LASER LITHOTRIPSY, STONE BASKETRY  AND STENT PLACEMENT (Left: Renal) HOLMIUM LASER APPLICATION (Left: Renal)  Patient Location: PACU  Anesthesia Type:General  Level of Consciousness: awake, alert  and oriented  Airway & Oxygen Therapy: Patient Spontanous Breathing and Patient connected to nasal cannula oxygen  Post-op Assessment: Report given to RN and Post -op Vital signs reviewed and stable  Post vital signs: Reviewed and stable  Last Vitals:  Vitals Value Taken Time  BP    Temp    Pulse 86 03/31/21 1017  Resp    SpO2 98 % 03/31/21 1017  Vitals shown include unvalidated device data.  Last Pain:  Vitals:   03/31/21 0802  TempSrc: Oral  PainSc: 0-No pain      Patients Stated Pain Goal: 4 ([id number])  Complications: No notable events documented.

## 2021-03-31 NOTE — Discharge Instructions (Addendum)
DISCHARGE INSTRUCTIONS FOR KIDNEY STONE/URETERAL STENT   MEDICATIONS:  1. Resume all your other meds from home  2. Pyridium can help with the burning/stinging when you urinate. 3. Tramadol is for moderate/severe pain, otherwise taking up to 1000 mg every 6 hours of plainTylenol will help treat your pain.   4. Take Cephalexin one hour prior to removal of your stent.  5. Tamuslosin will help with sten discomfort.  ACTIVITY:  1. No strenuous activity x 1week  2. No driving while on narcotic pain medications  3. Drink plenty of water  4. Continue to walk at home - you can still get blood clots when you are at home, so keep active, but don't over do it.  5. May return to work/school tomorrow or when you feel ready   BATHING:  1. You can shower and we recommend daily showers    SIGNS/SYMPTOMS TO CALL:  Please call us if you have a fever greater than 101.5, uncontrolled nausea/vomiting, uncontrolled pain, dizziness, unable to urinate, bloody urine, chest pain, shortness of breath, leg swelling, leg pain, redness around wound, drainage from wound, or any other concerns or questions.   You can reach [place] at [phone].   FOLLOW-UP:  1. Your stent will stay in place for approximately 2 weeks.  It will be removed in the office.  You will be contacted with this appointment.    Post Anesthesia Home Care Instructions  Activity: Get plenty of rest for the remainder of the day. A responsible individual must stay with you for 24 hours following the procedure.  For the next 24 hours, DO NOT: -Drive a car -Paediatric nurse -Drink alcoholic beverages -Take any medication unless instructed by your physician -Make any legal decisions or sign important papers.  Meals: Start with liquid foods such as gelatin or soup. Progress to regular foods as tolerated. Avoid greasy, spicy, heavy foods. If nausea and/or vomiting occur, drink only clear liquids until the nausea and/or vomiting subsides. Call your  physician if vomiting continues.  Special Instructions/Symptoms: Your throat may feel dry or sore from the anesthesia or the breathing tube placed in your throat during surgery. If this causes discomfort, gargle with warm salt water. The discomfort should disappear within 24 hours.

## 2021-03-31 NOTE — Interval H&P Note (Signed)
History and Physical Interval Note:  03/31/2021 8:36 AM  Deborah Thompson  has presented today for surgery, with the diagnosis of LEFT RENAL CALCULI.  The various methods of treatment have been discussed with the patient and family. After consideration of risks, benefits and other options for treatment, the patient has consented to  Procedure(s) with comments: CYSTOSCOPY WITH RETROGRADE PYELOGRAM, URETEROSCOPY AND STENT PLACEMENT (Left) - 31 MINS HOLMIUM LASER APPLICATION (Left) as a surgical intervention.  The patient's history has been reviewed, patient examined, no change in status, stable for surgery.  I have reviewed the patient's chart and labs.  Questions were answered to the patient's satisfaction.     Shaletha Humble D Tyresha Fede

## 2021-03-31 NOTE — H&P (Signed)
CC/HPI: cc: Left flank pain   03/26/21: 55 year old woman with a history of left hydronephrosis last seen by Dr. Alyson Ingles in 2019. At that time she underwent a Lasix renogram that showed normal excretion from the left kidney without obstruction. She has had a history of a left open nephrolithotomy as well as a left PCNL. CT of the abdomen pelvis from August 2022 showed recurrent stone burden the midpole and lower pole as well as possible filling defect on the left side. No hydronephrosis. Patient continues to have mild-to-moderate left flank pain. She denies any hematuria or UTIs.     ALLERGIES: None   MEDICATIONS: Lisinopril 40 mg tablet  Metoprolol Succinate  Aspir 81 81 mg tablet, delayed release  Atorvastatin Calcium 40 mg tablet  Calcium + D  Fish Oil  Glucosamine 1,000 mg tablet  Multiple Vitamin  Pantoprazole Sodium 40 mg tablet, delayed release  Spironolactone 25 mg tablet  Venlafaxine Hcl 75 mg tablet     Notes: Pipeline Flex Embolization Device- Dr. Luanne Bras REF#: [id number]; Lot#: [id number]   GU PSH: Cysto Remove Stent FB Sim - 2018 Percut Stone Removal >2cm, Left - 2018 Stone Removal Nephrost Tube, Left - 2018       PSH Notes: Wisdom teeth removed (1983), Left open Nephrolithotomy 2000, 2018, Aneurysm repair/stent placement (2017)   Skin Cancer Removal 2015- basal cell   NON-GU PSH: Dental Surgery Procedure     GU PMH: Hydronephrosis Unspec - 2019 Renal calculus - 2018, - 99991111 Renal colic - 99991111 Flank Pain - 2018      PMH Notes:  1898-08-02 00:00:00 - Note: Normal Routine History And Physical Adult   NON-GU PMH: Depression GERD Hypercholesterolemia Hypertension Stroke/TIA    FAMILY HISTORY: 1 Daughter - Daughter 1 son - Son father deceased - Father Heart Attack - Father, Mother Heart Disease - Father, Mother mother deceased - Mother   SOCIAL HISTORY: Marital Status: Divorced Preferred Language: English; Ethnicity: Not Hispanic Or Latino;  Race: White Current Smoking Status: Patient does not smoke anymore.   Tobacco Use Assessment Completed: Used Tobacco in last 30 days? Drinks 1 drink per month. Social Drinker.  Drinks 2 caffeinated drinks per day. 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    REVIEW OF SYSTEMS:    GU Review Female:   Patient denies frequent urination, hard to postpone urination, burning /pain with urination, get up at night to urinate, leakage of urine, stream starts and stops, trouble starting your stream, have to strain to urinate, and being pregnant.  Gastrointestinal (Upper):   Patient denies nausea, vomiting, and indigestion/ heartburn.  Gastrointestinal (Lower):   Patient denies diarrhea and constipation.  Constitutional:   Patient denies fever, night sweats, weight loss, and fatigue.  Skin:   Patient denies skin rash/ lesion and itching.  Eyes:   Patient denies blurred vision and double vision.  Ears/ Nose/ Throat:   Patient denies sore throat and sinus problems.  Hematologic/Lymphatic:   Patient denies swollen glands and easy bruising.  Cardiovascular:   Patient denies leg swelling and chest pains.  Respiratory:   Patient denies cough and shortness of breath.  Endocrine:   Patient denies excessive thirst.  Musculoskeletal:   Patient reports back pain and joint pain.   Neurological:   Patient reports headaches. Patient denies dizziness.  Psychologic:   Patient reports depression. Patient denies anxiety.   VITAL SIGNS:      03/26/2021 10:58 AM  Weight 172 lb / 78.02 kg  Height 65 in / 165.1 cm  BP 123/80 mmHg  Pulse 55 /min  Temperature 97.4 F / 36.3 C  BMI 28.6 kg/m   GU PHYSICAL EXAMINATION:      Notes: no cva tenderness left side    MULTI-SYSTEM PHYSICAL EXAMINATION:    Constitutional: Well-nourished. No physical deformities. Normally developed. Good grooming.  Neck: Neck symmetrical, not swollen. Normal tracheal position.  Respiratory: No labored breathing, no use of accessory  muscles.   Skin: No paleness, no jaundice, no cyanosis. No lesion, no ulcer, no rash.  Neurologic / Psychiatric: Oriented to time, oriented to place, oriented to person. No depression, no anxiety, no agitation.  Gastrointestinal: No rigidity, non obese abdomen.   Eyes: Normal conjunctivae. Normal eyelids.  Ears, Nose, Mouth, and Throat: Left ear no scars, no lesions, no masses. Right ear no scars, no lesions, no masses. Nose no scars, no lesions, no masses. Normal hearing. Normal lips.  Musculoskeletal: Normal gait and station of head and neck.     Complexity of Data:  Records Review:   Previous Patient Records  Urine Test Review:   Urinalysis  X-Ray Review: C.T. Abdomen/Pelvis: Reviewed Films. Reviewed Report. Discussed With Patient. IMPRESSION: Filling defects of the upper and mid pole of the left renal pelvis which do not appear to be enhancing, possibly due to blood clots. Recommend follow-up CT urogram to ensure resolution. Mild to moderate caliectasis of the left kidney, decreased compared to prior CT. Calculi of the mid and lower pole of the left kidney, stone burden is overall decreased compared to prior CTs. Electronically Signed By: Yetta Glassman M.D. On: 03/13/2021 13:21    PROCEDURES:          Urinalysis w/Scope Dipstick Dipstick Cont'd Micro  Color: Yellow Bilirubin: Neg mg/dL WBC/hpf: 0 - 5/hpf  Appearance: Clear Ketones: Neg mg/dL RBC/hpf: NS (Not Seen)  Specific Gravity: 1.025 Blood: Neg ery/uL Bacteria: Rare (0-9/hpf)  pH: 5.5 Protein: Neg mg/dL Cystals: NS (Not Seen)  Glucose: Neg mg/dL Urobilinogen: 0.2 mg/dL Casts: NS (Not Seen)    Nitrites: Neg Trichomonas: Not Present    Leukocyte Esterase: 1+ leu/uL Mucous: Not Present      Epithelial Cells: 0 - 5/hpf      Yeast: NS (Not Seen)      Sperm: Not Present    Notes: qns to spin    ASSESSMENT:      ICD-10 Details  1 GU:   Renal calculus - N20.0 Chronic, Worsening  2   Flank Pain - 0000000 Acute, Uncomplicated    PLAN:           Document Letter(s):  Created for Patient: Clinical Summary         Notes:   Risks and benefits of proceeding with a left diagnostic ureteroscopy with laser lithotripsy, stent placement and retrograde pyelogram were discussed with the patient in detail. She has had a stent in the past and tolerated okay. This will allow me to reduce stone burden as well as look at filling defect seen on CT scan. She understands this could possibly be a staged procedure. She will be contacted with surgery date.

## 2021-03-31 NOTE — Anesthesia Preprocedure Evaluation (Signed)
Anesthesia Evaluation  Patient identified by MRN, date of birth, ID band Patient awake    Reviewed: Allergy & Precautions, NPO status , Patient's Chart, lab work & pertinent test results  History of Anesthesia Complications (+) PONVNegative for: history of anesthetic complications  Airway Mallampati: IV  TM Distance: >3 FB Neck ROM: Full    Dental  (+) Dental Advisory Given, Teeth Intact   Pulmonary neg pulmonary ROS, former smoker,    Pulmonary exam normal        Cardiovascular hypertension, Normal cardiovascular exam  Echo 2017: Normal LV systolic function; grade 1 diastolic dysfunction; trace TR.    Neuro/Psych CVA (s/p aneurysm embolization), No Residual Symptoms    GI/Hepatic Neg liver ROS, GERD  ,  Endo/Other  negative endocrine ROS  Renal/GU Renal disease (LEFT RENAL CALCULI)  negative genitourinary   Musculoskeletal negative musculoskeletal ROS (+)   Abdominal   Peds  Hematology  (+) anemia ,   Anesthesia Other Findings   Reproductive/Obstetrics                            Anesthesia Physical Anesthesia Plan  ASA: 3  Anesthesia Plan: General   Post-op Pain Management:    Induction: Intravenous  PONV Risk Score and Plan: 4 or greater and Ondansetron, Dexamethasone, Midazolam and Treatment may vary due to age or medical condition  Airway Management Planned: LMA  Additional Equipment: None  Intra-op Plan:   Post-operative Plan: Extubation in OR  Informed Consent: I have reviewed the patients History and Physical, chart, labs and discussed the procedure including the risks, benefits and alternatives for the proposed anesthesia with the patient or authorized representative who has indicated his/her understanding and acceptance.     Dental advisory given  Plan Discussed with:   Anesthesia Plan Comments:         Anesthesia Quick Evaluation

## 2021-03-31 NOTE — Anesthesia Procedure Notes (Signed)
Procedure Name: LMA Insertion Date/Time: 03/31/2021 9:08 AM Performed by: Leeasia Secrist D, CRNA Pre-anesthesia Checklist: Patient identified, Emergency Drugs available, Suction available and Patient being monitored Patient Re-evaluated:Patient Re-evaluated prior to induction Oxygen Delivery Method: Circle system utilized Preoxygenation: Pre-oxygenation with 100% oxygen Induction Type: IV induction Ventilation: Mask ventilation without difficulty LMA: LMA inserted LMA Size: 4.0 Tube type: Oral Number of attempts: 1 Placement Confirmation: positive ETCO2 and breath sounds checked- equal and bilateral Tube secured with: Tape Dental Injury: Teeth and Oropharynx as per pre-operative assessment

## 2021-03-31 NOTE — Op Note (Signed)
Preoperative diagnosis: left renal calculi, possible filling defect on CT  Postoperative diagnosis: same  Procedure:  Cystoscopy left ureteroscopy, laser lithotripsy, basket stone extraction left 28F x 26 ureteral stent placement - no string left retrograde pyelography with interpretation  Surgeon: Jacalyn Lefevre, MD  Anesthesia: General  Complications: None  Intraoperative findings:  Normal urethra Bilateral orthotropic ureteral orifices left retrograde pyelography demonstrated dilated left collecting system without obvious filling defect Bladder mucosa normal without masses  Left pyeloscopy showed dilated collecting system of upper and mid poles Narrowing of left proximal ureter with edema however flexible ureteroscope passes easily - question submucosal stone fragment? Mid and lower pole calculi fragmented with larger pieces basketed (residual fragments in lower pole inaccessible)  EBL: Minimal  Specimens: left renal calculi  Disposition of specimens: [facility] for stone analysis  Indication: Deborah Thompson is a 55 y.o.  woman with a history of left hydronephrosis last seen by Dr. Alyson Ingles in 2019. At that time she underwent a Lasix renogram that showed normal excretion from the left kidney without obstruction. She has had a history of a left open nephrolithotomy as well as a left PCNL. CT of the abdomen pelvis from August 2022 showed recurrent stone burden the midpole and lower pole as well as possible filling defect on the left side. After reviewing the management options for treatment, the patient elected to proceed with the above surgical procedure(s). We have discussed the potential benefits and risks of the procedure, side effects of the proposed treatment, the likelihood of the patient achieving the goals of the procedure, and any potential problems that might occur during the procedure or recuperation. Informed consent has been obtained.   Description  of procedure:  The patient was taken to the operating room and general anesthesia was induced.  The patient was placed in the dorsal lithotomy position, prepped and draped in the usual sterile fashion, and preoperative antibiotics were administered. A preoperative time-out was performed.   Cystourethroscopy was performed.  The patient's urethra was examined and was normal. The bladder was then systematically examined in its entirety. There was no evidence for any bladder tumors, stones, or other mucosal pathology.    Attention then turned to the left ureteral orifice and a ureteral catheter was used to intubate the ureteral orifice.  Omnipaque contrast was injected through the ureteral catheter and a retrograde pyelogram was performed with findings as dictated above.  A 0.38 sensor guidewire was then advanced up the left ureter into the renal pelvis under fluoroscopic guidance.  A second sensor wire was placed in a similar manner.  Next ureteral access sheath was placed over the second wire and advanced to the proximal ureter with fluoroscopic guidance.  The inner sheath and wire were removed.    2 stones were encountered 1 in the midpole and 1 in the lower pole.  The lower pole calculus was difficult to access due to the position.  Both stones were then fragmented with a 200 m holmium laser fiber on a setting of 0.5 and 50.  All visible stones were then removed from the ureter with an N-gage nitinol basket.  Reinspection of the ureter revealed no remaining visible stones or fragments.   The wire was then backloaded through the cystoscope and a ureteral stent was advance over the wire using Seldinger technique.  The stent was positioned appropriately under fluoroscopic and cystoscopic guidance.  The wire was then removed with an adequate stent curl noted in the renal pelvis as well  as in the bladder.  The bladder was then emptied and the procedure ended.  The patient appeared to tolerate the procedure  well and without complications.  The patient was able to be awakened and transferred to the recovery unit in satisfactory condition.   Disposition: The stent will stay in place for approximately 2 weeks given the edema/narrowing of the proximal ureter.  No tether was left on the stent. Instructions for removing the stent have been provided to the patient.

## 2021-03-31 NOTE — Anesthesia Postprocedure Evaluation (Signed)
Anesthesia Post Note  Patient: Deborah Thompson  Procedure(s) Performed: CYSTOSCOPY WITH RETROGRADE PYELOGRAM, URETEROSCOPY, LASER LITHOTRIPSY, STONE BASKETRY  AND STENT PLACEMENT (Left: Renal) HOLMIUM LASER APPLICATION (Left: Renal)     Patient location during evaluation: PACU Anesthesia Type: General Level of consciousness: awake and alert Pain management: pain level controlled Vital Signs Assessment: post-procedure vital signs reviewed and stable Respiratory status: spontaneous breathing, nonlabored ventilation and respiratory function stable Cardiovascular status: blood pressure returned to baseline and stable Postop Assessment: no apparent nausea or vomiting Anesthetic complications: no   No notable events documented.  Last Vitals:  Vitals:   03/31/21 1045 03/31/21 1100  BP: 113/67 108/61  Pulse: 61 62  Resp: 12 10  Temp:    SpO2: 95% 96%    Last Pain:  Vitals:   03/31/21 1045  TempSrc:   PainSc: 0-No pain                 Lidia Collum

## 2021-04-01 ENCOUNTER — Encounter (HOSPITAL_BASED_OUTPATIENT_CLINIC_OR_DEPARTMENT_OTHER): Payer: Self-pay | Admitting: Urology

## 2021-04-20 ENCOUNTER — Other Ambulatory Visit (HOSPITAL_COMMUNITY): Payer: Self-pay

## 2021-05-08 ENCOUNTER — Other Ambulatory Visit (HOSPITAL_COMMUNITY): Payer: Self-pay

## 2021-05-08 MED ORDER — PANTOPRAZOLE SODIUM 40 MG PO TBEC
40.0000 mg | DELAYED_RELEASE_TABLET | Freq: Every day | ORAL | 2 refills | Status: DC
  Filled 2021-05-08: qty 90, 90d supply, fill #0

## 2021-05-08 MED ORDER — LISINOPRIL 40 MG PO TABS
40.0000 mg | ORAL_TABLET | Freq: Every day | ORAL | 2 refills | Status: DC
  Filled 2021-08-23: qty 90, 90d supply, fill #0

## 2021-05-08 MED ORDER — VENLAFAXINE HCL ER 37.5 MG PO CP24
37.5000 mg | ORAL_CAPSULE | Freq: Every day | ORAL | 2 refills | Status: DC
  Filled 2021-07-26: qty 90, 90d supply, fill #0
  Filled 2021-10-18: qty 90, 90d supply, fill #1

## 2021-05-08 MED ORDER — ATORVASTATIN CALCIUM 40 MG PO TABS
40.0000 mg | ORAL_TABLET | Freq: Every day | ORAL | 2 refills | Status: DC
  Filled 2021-06-29: qty 90, 90d supply, fill #0
  Filled 2021-10-04: qty 90, 90d supply, fill #1

## 2021-05-08 MED ORDER — METOPROLOL SUCCINATE ER 50 MG PO TB24
50.0000 mg | ORAL_TABLET | Freq: Every day | ORAL | 2 refills | Status: DC
  Filled 2021-07-01: qty 90, 90d supply, fill #0
  Filled 2021-09-20: qty 90, 90d supply, fill #1

## 2021-05-26 ENCOUNTER — Other Ambulatory Visit (HOSPITAL_COMMUNITY): Payer: Self-pay

## 2021-05-28 ENCOUNTER — Other Ambulatory Visit (HOSPITAL_COMMUNITY): Payer: Self-pay

## 2021-05-28 MED ORDER — SPIRONOLACTONE 25 MG PO TABS
12.5000 mg | ORAL_TABLET | Freq: Every day | ORAL | 3 refills | Status: DC
  Filled 2021-05-28: qty 45, 90d supply, fill #0
  Filled 2021-08-23: qty 45, 90d supply, fill #1

## 2021-06-08 ENCOUNTER — Other Ambulatory Visit (HOSPITAL_COMMUNITY): Payer: Self-pay

## 2021-06-19 ENCOUNTER — Other Ambulatory Visit (HOSPITAL_COMMUNITY): Payer: Self-pay

## 2021-06-19 MED ORDER — PANTOPRAZOLE SODIUM 40 MG PO TBEC
40.0000 mg | DELAYED_RELEASE_TABLET | Freq: Every day | ORAL | 2 refills | Status: DC
  Filled 2021-06-19 – 2021-08-09 (×2): qty 90, 90d supply, fill #0
  Filled 2021-11-08: qty 90, 90d supply, fill #1
  Filled 2022-05-11: qty 90, 90d supply, fill #2

## 2021-06-29 ENCOUNTER — Other Ambulatory Visit (HOSPITAL_COMMUNITY): Payer: Self-pay

## 2021-07-01 ENCOUNTER — Other Ambulatory Visit (HOSPITAL_COMMUNITY): Payer: Self-pay

## 2021-07-27 ENCOUNTER — Other Ambulatory Visit (HOSPITAL_COMMUNITY): Payer: Self-pay

## 2021-07-28 ENCOUNTER — Other Ambulatory Visit (HOSPITAL_COMMUNITY): Payer: Self-pay

## 2021-08-10 ENCOUNTER — Other Ambulatory Visit (HOSPITAL_COMMUNITY): Payer: Self-pay

## 2021-08-21 ENCOUNTER — Other Ambulatory Visit (HOSPITAL_COMMUNITY): Payer: Self-pay

## 2021-08-21 MED ORDER — PREDNISOLONE ACETATE 1 % OP SUSP
OPHTHALMIC | 0 refills | Status: AC
  Filled 2021-08-21: qty 5, 10d supply, fill #0

## 2021-08-24 ENCOUNTER — Other Ambulatory Visit (HOSPITAL_COMMUNITY): Payer: Self-pay

## 2021-09-21 ENCOUNTER — Other Ambulatory Visit (HOSPITAL_COMMUNITY): Payer: Self-pay

## 2021-10-05 ENCOUNTER — Other Ambulatory Visit (HOSPITAL_COMMUNITY): Payer: Self-pay

## 2021-10-19 ENCOUNTER — Other Ambulatory Visit (HOSPITAL_COMMUNITY): Payer: Self-pay

## 2021-11-09 ENCOUNTER — Other Ambulatory Visit (HOSPITAL_COMMUNITY): Payer: Self-pay

## 2021-11-12 ENCOUNTER — Other Ambulatory Visit (HOSPITAL_COMMUNITY): Payer: Self-pay

## 2021-11-12 MED ORDER — LISINOPRIL 40 MG PO TABS
40.0000 mg | ORAL_TABLET | Freq: Every day | ORAL | 2 refills | Status: DC
  Filled 2021-11-12 – 2021-11-29 (×2): qty 90, 90d supply, fill #0
  Filled 2022-02-28: qty 90, 90d supply, fill #1
  Filled 2022-08-08 – 2022-08-18 (×2): qty 90, 90d supply, fill #2

## 2021-11-12 MED ORDER — SPIRONOLACTONE 25 MG PO TABS
12.5000 mg | ORAL_TABLET | Freq: Every day | ORAL | 2 refills | Status: DC
  Filled 2021-11-12 – 2021-11-23 (×2): qty 45, 90d supply, fill #0
  Filled 2022-02-21: qty 45, 90d supply, fill #1

## 2021-11-12 MED ORDER — PANTOPRAZOLE SODIUM 40 MG PO TBEC
40.0000 mg | DELAYED_RELEASE_TABLET | Freq: Every day | ORAL | 2 refills | Status: DC
  Filled 2022-02-07: qty 90, 90d supply, fill #0

## 2021-11-12 MED ORDER — ATORVASTATIN CALCIUM 40 MG PO TABS
40.0000 mg | ORAL_TABLET | Freq: Every day | ORAL | 2 refills | Status: DC
  Filled 2021-11-12 – 2022-01-10 (×2): qty 90, 90d supply, fill #0
  Filled 2022-04-11: qty 90, 90d supply, fill #1
  Filled 2022-07-11 – 2022-07-14 (×2): qty 90, 90d supply, fill #2

## 2021-11-12 MED ORDER — METOPROLOL SUCCINATE ER 50 MG PO TB24
50.0000 mg | ORAL_TABLET | Freq: Every day | ORAL | 2 refills | Status: DC
  Filled 2021-11-12 – 2021-12-27 (×2): qty 90, 90d supply, fill #0
  Filled 2022-03-21: qty 90, 90d supply, fill #1

## 2021-11-12 MED ORDER — VENLAFAXINE HCL ER 37.5 MG PO CP24
37.5000 mg | ORAL_CAPSULE | Freq: Every day | ORAL | 2 refills | Status: DC
  Filled 2022-07-11 – 2022-07-14 (×2): qty 90, 90d supply, fill #0
  Filled 2022-10-10: qty 90, 90d supply, fill #1

## 2021-11-13 ENCOUNTER — Other Ambulatory Visit (HOSPITAL_COMMUNITY): Payer: Self-pay

## 2021-11-24 ENCOUNTER — Other Ambulatory Visit (HOSPITAL_COMMUNITY): Payer: Self-pay

## 2021-11-30 ENCOUNTER — Other Ambulatory Visit (HOSPITAL_COMMUNITY): Payer: Self-pay

## 2021-12-24 ENCOUNTER — Other Ambulatory Visit (HOSPITAL_COMMUNITY): Payer: Self-pay

## 2021-12-24 MED ORDER — TRIAMCINOLONE ACETONIDE 0.5 % EX CREA
1.0000 "application " | TOPICAL_CREAM | Freq: Two times a day (BID) | CUTANEOUS | 0 refills | Status: AC
  Filled 2021-12-24: qty 15, 8d supply, fill #0

## 2021-12-29 ENCOUNTER — Other Ambulatory Visit (HOSPITAL_COMMUNITY): Payer: Self-pay

## 2022-01-10 ENCOUNTER — Other Ambulatory Visit (HOSPITAL_COMMUNITY): Payer: Self-pay

## 2022-01-11 ENCOUNTER — Other Ambulatory Visit (HOSPITAL_COMMUNITY): Payer: Self-pay

## 2022-01-11 MED ORDER — VENLAFAXINE HCL ER 37.5 MG PO CP24
37.5000 mg | ORAL_CAPSULE | Freq: Every day | ORAL | 2 refills | Status: DC
  Filled 2022-01-11: qty 90, 90d supply, fill #0
  Filled 2022-04-11: qty 90, 90d supply, fill #1
  Filled 2023-01-06: qty 90, 90d supply, fill #2

## 2022-02-08 ENCOUNTER — Other Ambulatory Visit (HOSPITAL_COMMUNITY): Payer: Self-pay

## 2022-02-22 ENCOUNTER — Other Ambulatory Visit (HOSPITAL_COMMUNITY): Payer: Self-pay

## 2022-03-01 ENCOUNTER — Other Ambulatory Visit (HOSPITAL_COMMUNITY): Payer: Self-pay

## 2022-03-22 ENCOUNTER — Other Ambulatory Visit (HOSPITAL_COMMUNITY): Payer: Self-pay

## 2022-03-24 ENCOUNTER — Other Ambulatory Visit (HOSPITAL_COMMUNITY): Payer: Self-pay

## 2022-04-12 ENCOUNTER — Other Ambulatory Visit (HOSPITAL_COMMUNITY): Payer: Self-pay

## 2022-05-11 ENCOUNTER — Other Ambulatory Visit (HOSPITAL_COMMUNITY): Payer: Self-pay

## 2022-05-14 ENCOUNTER — Other Ambulatory Visit (HOSPITAL_COMMUNITY): Payer: Self-pay

## 2022-05-14 MED ORDER — PANTOPRAZOLE SODIUM 40 MG PO TBEC
40.0000 mg | DELAYED_RELEASE_TABLET | Freq: Every day | ORAL | 2 refills | Status: DC
  Filled 2022-05-14 – 2022-08-08 (×2): qty 90, 90d supply, fill #0
  Filled 2022-11-15: qty 90, 90d supply, fill #1

## 2022-05-14 MED ORDER — ATORVASTATIN CALCIUM 40 MG PO TABS
40.0000 mg | ORAL_TABLET | Freq: Every day | ORAL | 2 refills | Status: DC
  Filled 2022-05-14 – 2022-10-10 (×2): qty 90, 90d supply, fill #0
  Filled 2023-01-06: qty 90, 90d supply, fill #1

## 2022-05-14 MED ORDER — METOPROLOL SUCCINATE ER 50 MG PO TB24
50.0000 mg | ORAL_TABLET | Freq: Every day | ORAL | 2 refills | Status: DC
  Filled 2022-05-14 – 2022-09-30 (×3): qty 90, 90d supply, fill #0
  Filled 2023-01-06: qty 90, 90d supply, fill #1

## 2022-05-14 MED ORDER — LISINOPRIL 40 MG PO TABS
40.0000 mg | ORAL_TABLET | Freq: Every day | ORAL | 2 refills | Status: DC
  Filled 2022-05-14 – 2022-06-06 (×2): qty 90, 90d supply, fill #0
  Filled 2022-12-07: qty 90, 90d supply, fill #1

## 2022-05-14 MED ORDER — VENLAFAXINE HCL ER 37.5 MG PO CP24
37.5000 mg | ORAL_CAPSULE | Freq: Every day | ORAL | 3 refills | Status: DC
  Filled 2022-05-14: qty 90, 90d supply, fill #0

## 2022-05-14 MED ORDER — SPIRONOLACTONE 25 MG PO TABS
12.5000 mg | ORAL_TABLET | Freq: Every day | ORAL | 2 refills | Status: DC
  Filled 2022-05-14 – 2022-05-30 (×2): qty 45, 90d supply, fill #0
  Filled 2022-08-08: qty 45, 90d supply, fill #1
  Filled 2022-11-15: qty 45, 90d supply, fill #2

## 2022-05-27 ENCOUNTER — Other Ambulatory Visit (HOSPITAL_COMMUNITY): Payer: Self-pay

## 2022-05-31 ENCOUNTER — Other Ambulatory Visit (HOSPITAL_COMMUNITY): Payer: Self-pay

## 2022-06-07 ENCOUNTER — Other Ambulatory Visit (HOSPITAL_COMMUNITY): Payer: Self-pay

## 2022-06-11 ENCOUNTER — Ambulatory Visit (INDEPENDENT_AMBULATORY_CARE_PROVIDER_SITE_OTHER): Payer: No Typology Code available for payment source

## 2022-06-11 ENCOUNTER — Other Ambulatory Visit (HOSPITAL_COMMUNITY): Payer: Self-pay

## 2022-06-11 ENCOUNTER — Encounter (HOSPITAL_COMMUNITY): Payer: Self-pay | Admitting: *Deleted

## 2022-06-11 ENCOUNTER — Ambulatory Visit (HOSPITAL_COMMUNITY)
Admission: EM | Admit: 2022-06-11 | Discharge: 2022-06-11 | Disposition: A | Discharge status: Home / Self Care | Payer: No Typology Code available for payment source | Attending: Physician Assistant | Admitting: Physician Assistant

## 2022-06-11 DIAGNOSIS — M25571 Pain in right ankle and joints of right foot: Secondary | ICD-10-CM | POA: Diagnosis not present

## 2022-06-11 DIAGNOSIS — S92024A Nondisplaced fracture of anterior process of right calcaneus, initial encounter for closed fracture: Secondary | ICD-10-CM

## 2022-06-11 DIAGNOSIS — S93491A Sprain of other ligament of right ankle, initial encounter: Secondary | ICD-10-CM

## 2022-06-11 DIAGNOSIS — M79671 Pain in right foot: Secondary | ICD-10-CM

## 2022-06-11 DIAGNOSIS — S9031XA Contusion of right foot, initial encounter: Secondary | ICD-10-CM | POA: Diagnosis not present

## 2022-06-11 MED ORDER — IBUPROFEN 600 MG PO TABS
600.0000 mg | ORAL_TABLET | Freq: Four times a day (QID) | ORAL | 0 refills | Status: DC | PRN
  Filled 2022-06-11: qty 30, 8d supply, fill #0
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
## 2022-06-11 NOTE — Discharge Instructions (Addendum)
Advise good support shoe to give cushion and protect the area that is injured. Advised ice therapy, 10 minutes on 20 minutes off, 3-4 times throughout the evening to help reduce swelling and discomfort. Advised take ibuprofen 600 mg every 8 hours with food to help reduce pain and swelling. Advised follow-up PCP or return to urgent care if symptoms fail to improve.

## 2022-06-15 ENCOUNTER — Ambulatory Visit (INDEPENDENT_AMBULATORY_CARE_PROVIDER_SITE_OTHER): Payer: No Typology Code available for payment source | Admitting: Orthopedic Surgery

## 2022-06-15 DIAGNOSIS — S92034A Nondisplaced avulsion fracture of tuberosity of right calcaneus, initial encounter for closed fracture: Secondary | ICD-10-CM

## 2022-06-16 ENCOUNTER — Encounter: Payer: Self-pay | Admitting: Orthopedic Surgery
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
## 2022-06-21 ENCOUNTER — Other Ambulatory Visit (HOSPITAL_COMMUNITY): Payer: Self-pay

## 2022-06-28 ENCOUNTER — Other Ambulatory Visit (HOSPITAL_COMMUNITY): Payer: Self-pay

## 2022-07-11 ENCOUNTER — Other Ambulatory Visit (HOSPITAL_COMMUNITY): Payer: Self-pay

## 2022-07-12 ENCOUNTER — Other Ambulatory Visit (HOSPITAL_COMMUNITY): Payer: Self-pay

## 2022-07-13 ENCOUNTER — Encounter (HOSPITAL_COMMUNITY): Payer: Self-pay | Admitting: Pharmacist

## 2022-07-13 ENCOUNTER — Other Ambulatory Visit (HOSPITAL_COMMUNITY): Payer: Self-pay

## 2022-07-14 ENCOUNTER — Other Ambulatory Visit (HOSPITAL_COMMUNITY): Payer: Self-pay

## 2022-07-20 ENCOUNTER — Other Ambulatory Visit: Payer: Self-pay | Admitting: Family Medicine

## 2022-07-20 ENCOUNTER — Ambulatory Visit
Admission: RE | Admit: 2022-07-20 | Discharge: 2022-07-20 | Disposition: A | Discharge status: Home / Self Care | Payer: No Typology Code available for payment source | Source: Ambulatory Visit | Attending: Family Medicine | Admitting: Family Medicine

## 2022-07-20 DIAGNOSIS — M25551 Pain in right hip: Secondary | ICD-10-CM

## 2022-08-09 ENCOUNTER — Other Ambulatory Visit: Payer: Self-pay

## 2022-08-13 DIAGNOSIS — M25551 Pain in right hip: Secondary | ICD-10-CM | POA: Diagnosis not present

## 2022-08-13 DIAGNOSIS — M25571 Pain in right ankle and joints of right foot: Secondary | ICD-10-CM | POA: Diagnosis not present

## 2022-08-18 ENCOUNTER — Other Ambulatory Visit: Payer: Self-pay

## 2022-08-18 DIAGNOSIS — Z87442 Personal history of urinary calculi: Secondary | ICD-10-CM | POA: Diagnosis not present

## 2022-08-18 DIAGNOSIS — R300 Dysuria: Secondary | ICD-10-CM | POA: Diagnosis not present

## 2022-08-18 DIAGNOSIS — I10 Essential (primary) hypertension: Secondary | ICD-10-CM | POA: Diagnosis not present

## 2022-08-18 DIAGNOSIS — R7303 Prediabetes: Secondary | ICD-10-CM | POA: Diagnosis not present

## 2022-08-18 DIAGNOSIS — R399 Unspecified symptoms and signs involving the genitourinary system: Secondary | ICD-10-CM | POA: Diagnosis not present

## 2022-08-18 MED ORDER — CIPROFLOXACIN HCL 500 MG PO TABS
500.0000 mg | ORAL_TABLET | Freq: Two times a day (BID) | ORAL | 0 refills | Status: DC
  Filled 2022-08-18: qty 6, 3d supply, fill #0

## 2022-09-01 ENCOUNTER — Other Ambulatory Visit (HOSPITAL_COMMUNITY): Payer: Self-pay

## 2022-09-01 DIAGNOSIS — N200 Calculus of kidney: Secondary | ICD-10-CM | POA: Diagnosis not present

## 2022-09-01 DIAGNOSIS — R8271 Bacteriuria: Secondary | ICD-10-CM | POA: Diagnosis not present

## 2022-09-01 DIAGNOSIS — R300 Dysuria: Secondary | ICD-10-CM | POA: Diagnosis not present

## 2022-09-01 MED ORDER — CEPHALEXIN 500 MG PO CAPS
500.0000 mg | ORAL_CAPSULE | Freq: Four times a day (QID) | ORAL | 0 refills | Status: DC
  Filled 2022-09-01: qty 28, 7d supply, fill #0

## 2022-09-16 DIAGNOSIS — R310 Gross hematuria: Secondary | ICD-10-CM | POA: Diagnosis not present

## 2022-09-16 DIAGNOSIS — K5730 Diverticulosis of large intestine without perforation or abscess without bleeding: Secondary | ICD-10-CM | POA: Diagnosis not present

## 2022-09-16 DIAGNOSIS — N390 Urinary tract infection, site not specified: Secondary | ICD-10-CM | POA: Diagnosis not present

## 2022-09-16 DIAGNOSIS — K760 Fatty (change of) liver, not elsewhere classified: Secondary | ICD-10-CM | POA: Diagnosis not present

## 2022-09-16 DIAGNOSIS — R300 Dysuria: Secondary | ICD-10-CM | POA: Diagnosis not present

## 2022-09-16 DIAGNOSIS — R1084 Generalized abdominal pain: Secondary | ICD-10-CM | POA: Diagnosis not present

## 2022-09-16 DIAGNOSIS — N200 Calculus of kidney: Secondary | ICD-10-CM | POA: Diagnosis not present

## 2022-09-16 DIAGNOSIS — B952 Enterococcus as the cause of diseases classified elsewhere: Secondary | ICD-10-CM | POA: Diagnosis not present

## 2022-09-20 ENCOUNTER — Other Ambulatory Visit (HOSPITAL_COMMUNITY): Payer: Self-pay

## 2022-09-20 MED ORDER — NITROFURANTOIN MONOHYD MACRO 100 MG PO CAPS
100.0000 mg | ORAL_CAPSULE | Freq: Every day | ORAL | 0 refills | Status: DC
  Filled 2022-09-20: qty 90, 90d supply, fill #0

## 2022-09-20 MED ORDER — CIPROFLOXACIN HCL 500 MG PO TABS
500.0000 mg | ORAL_TABLET | Freq: Two times a day (BID) | ORAL | 0 refills | Status: DC
  Filled 2022-09-20: qty 10, 5d supply, fill #0

## 2022-09-30 ENCOUNTER — Other Ambulatory Visit (HOSPITAL_COMMUNITY): Payer: Self-pay

## 2022-09-30 ENCOUNTER — Other Ambulatory Visit: Payer: Self-pay

## 2022-10-11 ENCOUNTER — Other Ambulatory Visit (HOSPITAL_COMMUNITY): Payer: Self-pay

## 2022-10-11 ENCOUNTER — Other Ambulatory Visit: Payer: Self-pay

## 2022-11-02 DIAGNOSIS — H35371 Puckering of macula, right eye: Secondary | ICD-10-CM | POA: Diagnosis not present

## 2022-11-02 DIAGNOSIS — H5213 Myopia, bilateral: Secondary | ICD-10-CM | POA: Diagnosis not present

## 2022-11-02 DIAGNOSIS — H524 Presbyopia: Secondary | ICD-10-CM | POA: Diagnosis not present

## 2022-11-16 ENCOUNTER — Other Ambulatory Visit: Payer: Self-pay

## 2022-11-22 DIAGNOSIS — N3000 Acute cystitis without hematuria: Secondary | ICD-10-CM | POA: Diagnosis not present

## 2022-11-22 DIAGNOSIS — R300 Dysuria: Secondary | ICD-10-CM | POA: Diagnosis not present

## 2022-11-23 ENCOUNTER — Other Ambulatory Visit: Payer: Self-pay

## 2022-11-23 DIAGNOSIS — Z8673 Personal history of transient ischemic attack (TIA), and cerebral infarction without residual deficits: Secondary | ICD-10-CM | POA: Diagnosis not present

## 2022-11-23 DIAGNOSIS — I671 Cerebral aneurysm, nonruptured: Secondary | ICD-10-CM | POA: Diagnosis not present

## 2022-11-23 DIAGNOSIS — E7800 Pure hypercholesterolemia, unspecified: Secondary | ICD-10-CM | POA: Diagnosis not present

## 2022-11-23 DIAGNOSIS — F322 Major depressive disorder, single episode, severe without psychotic features: Secondary | ICD-10-CM | POA: Diagnosis not present

## 2022-11-23 DIAGNOSIS — Z0000 Encounter for general adult medical examination without abnormal findings: Secondary | ICD-10-CM | POA: Diagnosis not present

## 2022-11-23 DIAGNOSIS — I10 Essential (primary) hypertension: Secondary | ICD-10-CM | POA: Diagnosis not present

## 2022-11-23 DIAGNOSIS — R7309 Other abnormal glucose: Secondary | ICD-10-CM | POA: Diagnosis not present

## 2022-11-23 DIAGNOSIS — K219 Gastro-esophageal reflux disease without esophagitis: Secondary | ICD-10-CM | POA: Diagnosis not present

## 2022-11-23 MED ORDER — PANTOPRAZOLE SODIUM 40 MG PO TBEC
40.0000 mg | DELAYED_RELEASE_TABLET | Freq: Every day | ORAL | 4 refills | Status: DC
  Filled 2022-11-23 – 2023-02-25 (×2): qty 90, 90d supply, fill #0
  Filled 2023-05-29: qty 90, 90d supply, fill #1
  Filled 2023-08-11: qty 65, 65d supply, fill #2
  Filled 2023-08-28: qty 90, 90d supply, fill #2

## 2022-11-23 MED ORDER — METOPROLOL SUCCINATE ER 50 MG PO TB24
50.0000 mg | ORAL_TABLET | Freq: Every day | ORAL | 4 refills | Status: DC
  Filled 2022-11-23 – 2023-04-10 (×2): qty 90, 90d supply, fill #0
  Filled 2023-07-10: qty 90, 90d supply, fill #1
  Filled 2023-08-11: qty 23, 23d supply, fill #2
  Filled 2023-10-10: qty 90, 90d supply, fill #2

## 2022-11-23 MED ORDER — ATORVASTATIN CALCIUM 40 MG PO TABS
40.0000 mg | ORAL_TABLET | Freq: Every day | ORAL | 4 refills | Status: DC
  Filled 2023-05-08: qty 90, 90d supply, fill #0
  Filled 2023-08-07: qty 90, 90d supply, fill #1
  Filled 2023-11-06: qty 90, 90d supply, fill #2

## 2022-11-23 MED ORDER — SPIRONOLACTONE 25 MG PO TABS
12.5000 mg | ORAL_TABLET | Freq: Every day | ORAL | 4 refills | Status: DC
  Filled 2023-02-25: qty 45, 90d supply, fill #0
  Filled 2023-05-29: qty 45, 90d supply, fill #1
  Filled 2023-08-11: qty 32.5, 65d supply, fill #2
  Filled 2023-08-21: qty 45, 90d supply, fill #2

## 2022-11-23 MED ORDER — LISINOPRIL 40 MG PO TABS
40.0000 mg | ORAL_TABLET | Freq: Every day | ORAL | 4 refills | Status: DC
  Filled 2022-11-23 – 2023-03-26 (×3): qty 90, 90d supply, fill #0
  Filled 2023-07-04: qty 90, 90d supply, fill #1
  Filled 2023-08-11: qty 29, 29d supply, fill #2
  Filled 2023-10-10: qty 90, 90d supply, fill #2

## 2022-11-23 MED ORDER — VENLAFAXINE HCL ER 37.5 MG PO CP24
37.5000 mg | ORAL_CAPSULE | Freq: Every day | ORAL | 4 refills | Status: DC
  Filled 2022-11-23 – 2023-04-10 (×2): qty 90, 90d supply, fill #0
  Filled 2023-07-10: qty 90, 90d supply, fill #1
  Filled 2023-08-11: qty 23, 23d supply, fill #2
  Filled 2023-10-10: qty 90, 90d supply, fill #2

## 2022-12-07 ENCOUNTER — Other Ambulatory Visit: Payer: Self-pay

## 2022-12-07 ENCOUNTER — Other Ambulatory Visit (HOSPITAL_COMMUNITY): Payer: Self-pay

## 2022-12-17 ENCOUNTER — Other Ambulatory Visit (HOSPITAL_COMMUNITY): Payer: Self-pay

## 2023-01-07 ENCOUNTER — Other Ambulatory Visit: Payer: Self-pay

## 2023-02-25 ENCOUNTER — Other Ambulatory Visit (HOSPITAL_COMMUNITY): Payer: Self-pay

## 2023-02-26 ENCOUNTER — Other Ambulatory Visit (HOSPITAL_COMMUNITY): Payer: Self-pay

## 2023-03-23 ENCOUNTER — Other Ambulatory Visit (HOSPITAL_COMMUNITY): Payer: Self-pay

## 2023-03-23 DIAGNOSIS — N3000 Acute cystitis without hematuria: Secondary | ICD-10-CM | POA: Diagnosis not present

## 2023-03-23 MED ORDER — ESTRADIOL 0.1 MG/GM VA CREA
TOPICAL_CREAM | VAGINAL | 5 refills | Status: DC
  Filled 2023-03-23: qty 42.5, 30d supply, fill #0

## 2023-03-27 ENCOUNTER — Other Ambulatory Visit (HOSPITAL_COMMUNITY): Payer: Self-pay

## 2023-03-28 ENCOUNTER — Other Ambulatory Visit (HOSPITAL_COMMUNITY): Payer: Self-pay

## 2023-03-28 ENCOUNTER — Other Ambulatory Visit: Payer: Self-pay

## 2023-04-10 ENCOUNTER — Other Ambulatory Visit (HOSPITAL_COMMUNITY): Payer: Self-pay

## 2023-04-11 ENCOUNTER — Other Ambulatory Visit: Payer: Self-pay

## 2023-04-15 ENCOUNTER — Other Ambulatory Visit (HOSPITAL_BASED_OUTPATIENT_CLINIC_OR_DEPARTMENT_OTHER): Payer: Self-pay

## 2023-04-15 MED ORDER — COVID-19 MRNA VAC-TRIS(PFIZER) 30 MCG/0.3ML IM SUSY
0.3000 mL | PREFILLED_SYRINGE | Freq: Once | INTRAMUSCULAR | 0 refills | Status: AC
  Filled 2023-04-15: qty 0.3, 1d supply, fill #0

## 2023-04-28 ENCOUNTER — Other Ambulatory Visit (HOSPITAL_BASED_OUTPATIENT_CLINIC_OR_DEPARTMENT_OTHER): Payer: Self-pay

## 2023-04-28 MED ORDER — INFLUENZA VIRUS VACC SPLIT PF (FLUZONE) 0.5 ML IM SUSY
0.5000 mL | PREFILLED_SYRINGE | Freq: Once | INTRAMUSCULAR | 0 refills | Status: AC
  Filled 2023-04-29: qty 0.5, 1d supply, fill #0

## 2023-04-29 ENCOUNTER — Other Ambulatory Visit (HOSPITAL_BASED_OUTPATIENT_CLINIC_OR_DEPARTMENT_OTHER): Payer: Self-pay

## 2023-05-02 ENCOUNTER — Encounter: Payer: Self-pay | Admitting: Dermatology

## 2023-05-02 ENCOUNTER — Ambulatory Visit: Payer: 59 | Admitting: Dermatology

## 2023-05-02 VITALS — BP 98/60 | HR 68

## 2023-05-02 DIAGNOSIS — L920 Granuloma annulare: Secondary | ICD-10-CM

## 2023-05-02 NOTE — Patient Instructions (Addendum)
Granuloma annulare is a chronic benign skin condition characterized by pink smooth bumps or ring-like plaques most commonly appearing over the joints and the backs of the hands/feet. Its cause is not known, and most episodes of granuloma annulare clear up after a few years, with or without treatment.    Important Information  Due to recent changes in healthcare laws, you may see results of your pathology and/or laboratory studies on MyChart before the doctors have had a chance to review them. We understand that in some cases there may be results that are confusing or concerning to you. Please understand that not all results are received at the same time and often the doctors may need to interpret multiple results in order to provide you with the best plan of care or course of treatment. Therefore, we ask that you please give [place] 2 business days to thoroughly review all your results before contacting the office for clarification. Should we see a critical lab result, you will be contacted sooner.   If You Need Anything After Your Visit  If you have any questions or concerns for your doctor, please call our main line at [phone] If no one answers, please leave a voicemail as directed and we will return your call as soon as possible. Messages left after 4 pm will be answered the following business day.   You may also send [place] a message via MyChart. We typically respond to MyChart messages within 1-2 business days.  For prescription refills, please ask your pharmacy to contact our office. Our fax number is [phone].  If you have an urgent issue when the clinic is closed that cannot wait until the next business day, you can page your doctor at the number below.    Please note that while we do our best to be available for urgent issues outside of office hours, we are not available 24/7.   If you have an urgent issue and are unable to reach [place], you may choose to seek medical care at your doctor's  office, retail clinic, urgent care center, or emergency room.  If you have a medical emergency, please immediately call 911 or go to the emergency department. In the event of inclement weather, please call our main line at [phone] for an update on the status of any delays or closures.  Dermatology Medication Tips: Please keep the boxes that topical medications come in in order to help keep track of the instructions about where and how to use these. Pharmacies typically print the medication instructions only on the boxes and not directly on the medication tubes.   If your medication is too expensive, please contact our office at [phone] or send [place] a message through MyChart.   We are unable to tell what your co-pay for medications will be in advance as this is different depending on your insurance coverage. However, we may be able to find a substitute medication at lower cost or fill out paperwork to get insurance to cover a needed medication.   If a prior authorization is required to get your medication covered by your insurance company, please allow [place] 1-2 business days to complete this process.  Drug prices often vary depending on where the prescription is filled and some pharmacies may offer cheaper prices.  The website www.goodrx.com contains coupons for medications through different pharmacies. The prices here do not account for what the cost may be with help from insurance (it may be cheaper with your insurance), but the website  can give you the price if you did not use any insurance.  - You can print the associated coupon and take it with your prescription to the pharmacy.  - You may also stop by our office during regular business hours and pick up a GoodRx coupon card.  - If you need your prescription sent electronically to a different pharmacy, notify our office through [facility] or by phone at [phone]

## 2023-05-02 NOTE — Progress Notes (Signed)
   New Patient Visit   Subjective  Deborah Thompson is a 57 y.o. female who presents for the following: spot at right foot that noticed over a year that doesn't itch. Reports has been seen by primary care and given cream but rash/spot still remains.    The patient has spots, moles and lesions to be evaluated, some may be new or changing and the patient may have concern these could be cancer.   The following portions of the chart were reviewed this encounter and updated as appropriate: medications, allergies, medical history  Review of Systems:  No other skin or systemic complaints except as noted in HPI or Assessment and Plan.  Objective  Well appearing patient in no apparent distress; mood and affect are within normal limits.   A focused examination was performed of the following areas: Right dorsal foot  Relevant exam findings are noted in the Assessment and Plan.       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 Its cause is not known, and most episodes of granuloma annulare clear up after a few years, with or without treatment.  Treatment Plan: Benign. Observe   Offered stronger topical steroid Clobetasol. Patient deferred.   Offered Intralesional injections of Kenalog. Patient deferred.   Will measure today and evaluate at next follow up. If growing larger will inject.     3 - 4 month follow up    No follow-ups on file.  I, Asher Muir, CMA, am acting as scribe for [employer], DO.   Documentation: I have reviewed the above documentation for accuracy and completeness, and I agree with the above.  Langston Reusing, DO

## 2023-05-08 ENCOUNTER — Other Ambulatory Visit (HOSPITAL_COMMUNITY): Payer: Self-pay

## 2023-05-09 ENCOUNTER — Other Ambulatory Visit (HOSPITAL_COMMUNITY): Payer: Self-pay

## 2023-05-09 ENCOUNTER — Other Ambulatory Visit: Payer: Self-pay

## 2023-05-25 DIAGNOSIS — K219 Gastro-esophageal reflux disease without esophagitis: Secondary | ICD-10-CM | POA: Diagnosis not present

## 2023-05-25 DIAGNOSIS — I10 Essential (primary) hypertension: Secondary | ICD-10-CM | POA: Diagnosis not present

## 2023-05-25 DIAGNOSIS — F322 Major depressive disorder, single episode, severe without psychotic features: Secondary | ICD-10-CM | POA: Diagnosis not present

## 2023-05-25 DIAGNOSIS — R7303 Prediabetes: Secondary | ICD-10-CM | POA: Diagnosis not present

## 2023-05-25 DIAGNOSIS — E7800 Pure hypercholesterolemia, unspecified: Secondary | ICD-10-CM | POA: Diagnosis not present

## 2023-05-26 ENCOUNTER — Other Ambulatory Visit (HOSPITAL_COMMUNITY): Payer: Self-pay

## 2023-05-26 ENCOUNTER — Other Ambulatory Visit: Payer: Self-pay

## 2023-05-26 MED ORDER — METFORMIN HCL ER 750 MG PO TB24
750.0000 mg | ORAL_TABLET | Freq: Every evening | ORAL | 1 refills | Status: DC
  Filled 2023-05-26: qty 90, 90d supply, fill #0
  Filled 2023-08-11: qty 69, 69d supply, fill #1
  Filled 2023-08-14: qty 90, 90d supply, fill #1
  Filled 2023-11-24: qty 90, 90d supply, fill #2

## 2023-05-30 ENCOUNTER — Other Ambulatory Visit: Payer: Self-pay

## 2023-06-09 DIAGNOSIS — Z113 Encounter for screening for infections with a predominantly sexual mode of transmission: Secondary | ICD-10-CM | POA: Diagnosis not present

## 2023-06-09 DIAGNOSIS — Z01411 Encounter for gynecological examination (general) (routine) with abnormal findings: Secondary | ICD-10-CM | POA: Diagnosis not present

## 2023-06-09 DIAGNOSIS — Z1231 Encounter for screening mammogram for malignant neoplasm of breast: Secondary | ICD-10-CM | POA: Diagnosis not present

## 2023-06-09 DIAGNOSIS — Z1331 Encounter for screening for depression: Secondary | ICD-10-CM | POA: Diagnosis not present

## 2023-06-09 DIAGNOSIS — Z01419 Encounter for gynecological examination (general) (routine) without abnormal findings: Secondary | ICD-10-CM | POA: Diagnosis not present

## 2023-06-09 DIAGNOSIS — Z124 Encounter for screening for malignant neoplasm of cervix: Secondary | ICD-10-CM | POA: Diagnosis not present

## 2023-06-24 ENCOUNTER — Other Ambulatory Visit (HOSPITAL_BASED_OUTPATIENT_CLINIC_OR_DEPARTMENT_OTHER): Payer: Self-pay

## 2023-07-05 ENCOUNTER — Other Ambulatory Visit: Payer: Self-pay

## 2023-07-11 ENCOUNTER — Other Ambulatory Visit: Payer: Self-pay

## 2023-07-11 ENCOUNTER — Other Ambulatory Visit (HOSPITAL_COMMUNITY): Payer: Self-pay

## 2023-08-01 ENCOUNTER — Ambulatory Visit: Payer: 59 | Admitting: Dermatology

## 2023-08-07 ENCOUNTER — Other Ambulatory Visit (HOSPITAL_COMMUNITY): Payer: Self-pay

## 2023-08-08 ENCOUNTER — Other Ambulatory Visit (HOSPITAL_COMMUNITY): Payer: Self-pay

## 2023-08-11 ENCOUNTER — Other Ambulatory Visit: Payer: Self-pay

## 2023-08-11 ENCOUNTER — Other Ambulatory Visit (HOSPITAL_BASED_OUTPATIENT_CLINIC_OR_DEPARTMENT_OTHER): Payer: Self-pay

## 2023-08-15 ENCOUNTER — Other Ambulatory Visit: Payer: Self-pay

## 2023-08-15 ENCOUNTER — Other Ambulatory Visit (HOSPITAL_COMMUNITY): Payer: Self-pay

## 2023-08-22 ENCOUNTER — Other Ambulatory Visit: Payer: Self-pay

## 2023-08-24 DIAGNOSIS — E118 Type 2 diabetes mellitus with unspecified complications: Secondary | ICD-10-CM | POA: Diagnosis not present

## 2023-08-28 ENCOUNTER — Other Ambulatory Visit (HOSPITAL_COMMUNITY): Payer: Self-pay

## 2023-08-29 ENCOUNTER — Other Ambulatory Visit: Payer: Self-pay

## 2023-08-31 ENCOUNTER — Other Ambulatory Visit (HOSPITAL_COMMUNITY): Payer: Self-pay

## 2023-09-06 ENCOUNTER — Ambulatory Visit (INDEPENDENT_AMBULATORY_CARE_PROVIDER_SITE_OTHER): Payer: 59 | Admitting: Dermatology

## 2023-09-06 ENCOUNTER — Encounter: Payer: Self-pay | Admitting: Dermatology

## 2023-09-06 VITALS — BP 110/71

## 2023-09-06 DIAGNOSIS — L578 Other skin changes due to chronic exposure to nonionizing radiation: Secondary | ICD-10-CM

## 2023-09-06 DIAGNOSIS — L821 Other seborrheic keratosis: Secondary | ICD-10-CM | POA: Diagnosis not present

## 2023-09-06 DIAGNOSIS — Z1283 Encounter for screening for malignant neoplasm of skin: Secondary | ICD-10-CM

## 2023-09-06 DIAGNOSIS — W908XXA Exposure to other nonionizing radiation, initial encounter: Secondary | ICD-10-CM

## 2023-09-06 DIAGNOSIS — D1801 Hemangioma of skin and subcutaneous tissue: Secondary | ICD-10-CM | POA: Diagnosis not present

## 2023-09-06 DIAGNOSIS — L920 Granuloma annulare: Secondary | ICD-10-CM | POA: Diagnosis not present

## 2023-09-06 DIAGNOSIS — D229 Melanocytic nevi, unspecified: Secondary | ICD-10-CM

## 2023-09-06 DIAGNOSIS — L814 Other melanin hyperpigmentation: Secondary | ICD-10-CM | POA: Diagnosis not present

## 2023-09-06 MED ORDER — TRIAMCINOLONE ACETONIDE 10 MG/ML IJ SUSP
10.0000 mg | Freq: Once | INTRAMUSCULAR | Status: AC
Start: 2023-09-06 — End: 2023-09-06
  Administered 2023-09-06: 10 mg

## 2023-09-06 NOTE — Patient Instructions (Signed)

## 2023-09-06 NOTE — Progress Notes (Signed)
   Follow-Up Visit   Subjective  Deborah Thompson is a 58 y.o. female who presents for the following: Granuloma annulare & TBSE  Patient present today for follow up visit. Patient was last evaluated on 05/02/23. Patient reports sxs are unchanged. Patient denies medication changes.  The following portions of the chart were reviewed this encounter and updated as appropriate: medications, allergies, medical history  Review of Systems:  No other skin or systemic complaints except as noted in HPI or Assessment and Plan.  Objective  Well appearing patient in no apparent distress; mood and affect are within normal limits.   A focused examination was performed of the following areas:total body   Relevant exam findings are noted in the Assessment and Plan.      Right Foot - Anterior (9) Procedure Note Intralesional Injection  Location: right foot  Informed Consent: Discussed risks (infection, pain, bleeding, bruising, thinning of the skin, loss of skin pigment, lack of resolution, and recurrence of lesion) and benefits of the procedure, as well as the alternatives. Informed consent was obtained. Preparation: The area was prepared a standard fashion.  Anesthesia: n/a  Procedure Details: An intralesional injection was performed with Kenalog  10 mg/cc. 0.3 cc in total were injected. NDC #: 9996-9505-79 Exp: 09/2024  Total number of injections: 9  Plan: The patient was instructed on post-op care. Recommend OTC analgesia as needed for pain.   Assessment & Plan  LENTIGINES, SEBORRHEIC KERATOSES, HEMANGIOMAS - Benign normal skin lesions - Benign-appearing - Call for any changes   BENIGN MELANOCYTIC NEVI - Tan-brown and/or pink-flesh-colored symmetric macules and papules - Benign appearing on exam today - Observation - Call clinic for new or changing moles - Recommend daily use of broad spectrum spf 30+ sunscreen to sun-exposed areas.    ACTINIC DAMAGE - Chronic condition,  secondary to cumulative UV/sun exposure - diffuse scaly erythematous macules with underlying dyspigmentation - Recommend daily broad spectrum sunscreen SPF 30+ to sun-exposed areas, reapply every 2 hours as needed.  - Staying in the shade or wearing long sleeves, sun glasses (UVA+UVB protection) and wide brim hats (4-inch brim around the entire circumference of the hat) are also recommended for sun protection.  - Call for new or changing lesions.   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    Documentation: I have reviewed the above documentation for accuracy and completeness, and I agree with the above.   I, Shirron Maranda, CMA, am acting as scribe for [employer], DO.   Delon Lenis, DO

## 2023-10-10 ENCOUNTER — Other Ambulatory Visit (HOSPITAL_COMMUNITY): Payer: Self-pay

## 2023-10-14 ENCOUNTER — Other Ambulatory Visit: Payer: Self-pay

## 2023-11-02 ENCOUNTER — Encounter: Payer: Self-pay | Admitting: Internal Medicine

## 2023-11-03 DIAGNOSIS — H35371 Puckering of macula, right eye: Secondary | ICD-10-CM | POA: Diagnosis not present

## 2023-11-03 DIAGNOSIS — H524 Presbyopia: Secondary | ICD-10-CM | POA: Diagnosis not present

## 2023-11-03 DIAGNOSIS — H43813 Vitreous degeneration, bilateral: Secondary | ICD-10-CM | POA: Diagnosis not present

## 2023-11-03 DIAGNOSIS — H5213 Myopia, bilateral: Secondary | ICD-10-CM | POA: Diagnosis not present

## 2023-11-06 ENCOUNTER — Other Ambulatory Visit (HOSPITAL_COMMUNITY): Payer: Self-pay

## 2023-11-07 ENCOUNTER — Other Ambulatory Visit (HOSPITAL_COMMUNITY): Payer: Self-pay

## 2023-11-25 ENCOUNTER — Other Ambulatory Visit (HOSPITAL_COMMUNITY): Payer: Self-pay

## 2023-11-25 MED ORDER — METFORMIN HCL ER 750 MG PO TB24
750.0000 mg | ORAL_TABLET | Freq: Every evening | ORAL | 1 refills | Status: DC
  Filled 2023-11-25: qty 90, 90d supply, fill #0
  Filled 2024-02-10: qty 90, 90d supply, fill #1
  Filled 2024-05-18 – 2024-05-25 (×2): qty 90, 90d supply, fill #2

## 2023-11-27 ENCOUNTER — Other Ambulatory Visit (HOSPITAL_COMMUNITY): Payer: Self-pay

## 2023-11-28 ENCOUNTER — Other Ambulatory Visit (HOSPITAL_COMMUNITY): Payer: Self-pay

## 2023-11-28 ENCOUNTER — Other Ambulatory Visit: Payer: Self-pay

## 2023-11-28 MED ORDER — SPIRONOLACTONE 25 MG PO TABS
12.5000 mg | ORAL_TABLET | Freq: Every day | ORAL | 1 refills | Status: DC
  Filled 2023-11-28: qty 45, 90d supply, fill #0
  Filled 2024-02-25: qty 45, 90d supply, fill #1
  Filled 2024-05-25: qty 45, 90d supply, fill #2

## 2023-11-28 MED ORDER — PANTOPRAZOLE SODIUM 40 MG PO TBEC
40.0000 mg | DELAYED_RELEASE_TABLET | Freq: Every day | ORAL | 1 refills | Status: DC
  Filled 2023-11-28: qty 90, 90d supply, fill #0
  Filled 2024-02-25: qty 90, 90d supply, fill #1
  Filled 2024-06-08: qty 20, 20d supply, fill #2

## 2023-12-15 ENCOUNTER — Ambulatory Visit: Payer: 59 | Admitting: Dermatology

## 2023-12-30 DIAGNOSIS — N200 Calculus of kidney: Secondary | ICD-10-CM | POA: Diagnosis not present

## 2023-12-30 DIAGNOSIS — N3020 Other chronic cystitis without hematuria: Secondary | ICD-10-CM | POA: Diagnosis not present

## 2024-01-08 ENCOUNTER — Other Ambulatory Visit (HOSPITAL_COMMUNITY): Payer: Self-pay

## 2024-01-09 ENCOUNTER — Other Ambulatory Visit (HOSPITAL_COMMUNITY): Payer: Self-pay

## 2024-01-09 MED ORDER — VENLAFAXINE HCL ER 37.5 MG PO CP24
37.5000 mg | ORAL_CAPSULE | Freq: Every day | ORAL | 0 refills | Status: DC
  Filled 2024-01-09: qty 90, 90d supply, fill #0
  Filled 2024-04-06: qty 10, 10d supply, fill #1

## 2024-01-10 ENCOUNTER — Other Ambulatory Visit (HOSPITAL_COMMUNITY): Payer: Self-pay

## 2024-01-20 ENCOUNTER — Other Ambulatory Visit: Payer: Self-pay

## 2024-01-20 ENCOUNTER — Other Ambulatory Visit (HOSPITAL_COMMUNITY): Payer: Self-pay

## 2024-01-20 MED ORDER — ATORVASTATIN CALCIUM 40 MG PO TABS
40.0000 mg | ORAL_TABLET | Freq: Every day | ORAL | 0 refills | Status: DC
  Filled 2024-02-10: qty 90, 90d supply, fill #0
  Filled 2024-05-11: qty 90, 90d supply, fill #1

## 2024-01-20 MED ORDER — METOPROLOL SUCCINATE ER 50 MG PO TB24
50.0000 mg | ORAL_TABLET | Freq: Every day | ORAL | 0 refills | Status: DC
  Filled 2024-01-20 – 2024-01-24 (×2): qty 90, 90d supply, fill #0
  Filled 2024-04-20: qty 90, 90d supply, fill #1

## 2024-01-20 MED ORDER — LISINOPRIL 40 MG PO TABS
40.0000 mg | ORAL_TABLET | Freq: Every day | ORAL | 0 refills | Status: DC
  Filled 2024-01-20 – 2024-01-24 (×2): qty 90, 90d supply, fill #0
  Filled 2024-04-20: qty 90, 90d supply, fill #1

## 2024-01-23 DIAGNOSIS — R1110 Vomiting, unspecified: Secondary | ICD-10-CM | POA: Diagnosis not present

## 2024-01-23 DIAGNOSIS — E7800 Pure hypercholesterolemia, unspecified: Secondary | ICD-10-CM | POA: Diagnosis not present

## 2024-01-23 DIAGNOSIS — K219 Gastro-esophageal reflux disease without esophagitis: Secondary | ICD-10-CM | POA: Diagnosis not present

## 2024-01-23 DIAGNOSIS — F322 Major depressive disorder, single episode, severe without psychotic features: Secondary | ICD-10-CM | POA: Diagnosis not present

## 2024-01-23 DIAGNOSIS — R809 Proteinuria, unspecified: Secondary | ICD-10-CM | POA: Diagnosis not present

## 2024-01-23 DIAGNOSIS — E1169 Type 2 diabetes mellitus with other specified complication: Secondary | ICD-10-CM | POA: Diagnosis not present

## 2024-01-23 DIAGNOSIS — R197 Diarrhea, unspecified: Secondary | ICD-10-CM | POA: Diagnosis not present

## 2024-01-23 DIAGNOSIS — I10 Essential (primary) hypertension: Secondary | ICD-10-CM | POA: Diagnosis not present

## 2024-01-24 ENCOUNTER — Other Ambulatory Visit: Payer: Self-pay

## 2024-02-10 ENCOUNTER — Other Ambulatory Visit: Payer: Self-pay

## 2024-02-10 ENCOUNTER — Encounter: Payer: Self-pay | Admitting: Internal Medicine

## 2024-02-10 ENCOUNTER — Other Ambulatory Visit (HOSPITAL_COMMUNITY): Payer: Self-pay

## 2024-02-11 ENCOUNTER — Other Ambulatory Visit (HOSPITAL_COMMUNITY): Payer: Self-pay

## 2024-02-13 ENCOUNTER — Other Ambulatory Visit (HOSPITAL_COMMUNITY): Payer: Self-pay

## 2024-02-27 ENCOUNTER — Other Ambulatory Visit (HOSPITAL_COMMUNITY): Payer: Self-pay

## 2024-02-28 ENCOUNTER — Other Ambulatory Visit (HOSPITAL_BASED_OUTPATIENT_CLINIC_OR_DEPARTMENT_OTHER): Payer: Self-pay

## 2024-02-28 ENCOUNTER — Other Ambulatory Visit: Payer: Self-pay

## 2024-02-28 ENCOUNTER — Other Ambulatory Visit (HOSPITAL_COMMUNITY): Payer: Self-pay

## 2024-02-28 DIAGNOSIS — K219 Gastro-esophageal reflux disease without esophagitis: Secondary | ICD-10-CM | POA: Diagnosis not present

## 2024-02-28 DIAGNOSIS — I10 Essential (primary) hypertension: Secondary | ICD-10-CM | POA: Diagnosis not present

## 2024-02-28 DIAGNOSIS — E118 Type 2 diabetes mellitus with unspecified complications: Secondary | ICD-10-CM | POA: Diagnosis not present

## 2024-02-28 DIAGNOSIS — E782 Mixed hyperlipidemia: Secondary | ICD-10-CM | POA: Diagnosis not present

## 2024-02-28 DIAGNOSIS — F331 Major depressive disorder, recurrent, moderate: Secondary | ICD-10-CM | POA: Diagnosis not present

## 2024-02-28 DIAGNOSIS — Z0000 Encounter for general adult medical examination without abnormal findings: Secondary | ICD-10-CM | POA: Diagnosis not present

## 2024-02-28 DIAGNOSIS — Z23 Encounter for immunization: Secondary | ICD-10-CM | POA: Diagnosis not present

## 2024-02-28 MED ORDER — OZEMPIC (0.25 OR 0.5 MG/DOSE) 2 MG/3ML ~~LOC~~ SOPN
0.2500 mg | PEN_INJECTOR | SUBCUTANEOUS | 1 refills | Status: AC
  Filled 2024-02-28: qty 3, 30d supply, fill #0
  Filled 2024-02-28: qty 6, 112d supply, fill #0
  Filled 2024-03-07 (×2): qty 3, 30d supply, fill #0
  Filled 2024-04-06: qty 3, 56d supply, fill #1

## 2024-02-29 ENCOUNTER — Other Ambulatory Visit: Payer: Self-pay

## 2024-02-29 ENCOUNTER — Other Ambulatory Visit (HOSPITAL_COMMUNITY): Payer: Self-pay

## 2024-03-07 ENCOUNTER — Other Ambulatory Visit: Payer: Self-pay

## 2024-03-07 ENCOUNTER — Other Ambulatory Visit (HOSPITAL_COMMUNITY): Payer: Self-pay

## 2024-03-08 ENCOUNTER — Other Ambulatory Visit (HOSPITAL_BASED_OUTPATIENT_CLINIC_OR_DEPARTMENT_OTHER): Payer: Self-pay

## 2024-03-13 ENCOUNTER — Encounter: Payer: Self-pay | Admitting: Internal Medicine

## 2024-03-13 ENCOUNTER — Other Ambulatory Visit (HOSPITAL_COMMUNITY): Payer: Self-pay

## 2024-03-13 ENCOUNTER — Ambulatory Visit (AMBULATORY_SURGERY_CENTER)

## 2024-03-13 VITALS — Ht 66.0 in | Wt 180.0 lb

## 2024-03-13 DIAGNOSIS — Z1211 Encounter for screening for malignant neoplasm of colon: Secondary | ICD-10-CM

## 2024-03-13 MED ORDER — NA SULFATE-K SULFATE-MG SULF 17.5-3.13-1.6 GM/177ML PO SOLN
1.0000 | Freq: Once | ORAL | 0 refills | Status: AC
  Filled 2024-03-13: qty 354, 1d supply, fill #0

## 2024-03-13 NOTE — Progress Notes (Addendum)
 No egg or soy allergy known to patient  No issues known to pt with past sedation with any surgeries or procedures Patient denies ever being told they had issues or difficulty with intubation  No FH of Malignant Hyperthermia Pt is not on diet pills; Pt. Is on GLP-1 mediation Pt is not on home 02  Pt is not on blood thinners  Pt denies issues with chronic constipation  No A fib or A flutter Have any cardiac testing pending--no Pt instructed to use Singlecare.com or GoodRx for a price reduction on prep  Patient has pipeline embolization device Ambulates independently

## 2024-03-27 ENCOUNTER — Encounter: Payer: Self-pay | Admitting: Internal Medicine

## 2024-03-27 ENCOUNTER — Ambulatory Visit: Admitting: Internal Medicine

## 2024-03-27 VITALS — BP 111/69 | HR 72 | Temp 98.4°F | Resp 17 | Ht 66.0 in | Wt 180.0 lb

## 2024-03-27 DIAGNOSIS — K5730 Diverticulosis of large intestine without perforation or abscess without bleeding: Secondary | ICD-10-CM

## 2024-03-27 DIAGNOSIS — Z1211 Encounter for screening for malignant neoplasm of colon: Secondary | ICD-10-CM

## 2024-03-27 DIAGNOSIS — F32A Depression, unspecified: Secondary | ICD-10-CM | POA: Diagnosis not present

## 2024-03-27 DIAGNOSIS — I10 Essential (primary) hypertension: Secondary | ICD-10-CM | POA: Diagnosis not present

## 2024-03-27 DIAGNOSIS — E7800 Pure hypercholesterolemia, unspecified: Secondary | ICD-10-CM | POA: Diagnosis not present

## 2024-03-27 MED ORDER — SODIUM CHLORIDE 0.9 % IV SOLN: 500.0000 mL | INTRAVENOUS | Status: DC

## 2024-03-27 NOTE — Progress Notes (Signed)
 Pt's states no medical or surgical changes since previsit or office visit.

## 2024-03-27 NOTE — Op Note (Signed)
  [facility] Patient Name: Deborah Thompson Procedure Date: 03/27/2024 9:43 AM MRN: [id number] Endoscopist: Norleen SAILOR. Abran , MD, [id number] Age: 58 Referring MD:  Date of Birth: 03-10-1966 Gender: Female Account #: [id number] Procedure:                Colonoscopy Indications:              Screening for colorectal malignant neoplasm. Index                            exam 2015 was negative for neoplasia Medicines:                Monitored Anesthesia Care Procedure:                Pre-Anesthesia Assessment:                           - Prior to the procedure, a History and Physical                            was performed, and patient medications and                            allergies were reviewed. The patient's tolerance of                            previous anesthesia was also reviewed. The risks                            and benefits of the procedure and the sedation                            options and risks were discussed with the patient.                            All questions were answered, and informed consent                            was obtained. Prior Anticoagulants: The patient has                            taken no anticoagulant or antiplatelet agents. ASA                            Grade Assessment: II - A patient with mild systemic                            disease. After reviewing the risks and benefits,                            the patient was deemed in satisfactory condition to                            undergo the procedure.  After obtaining informed consent, the colonoscope                            was passed under direct vision. Throughout the                            procedure, the patient's blood pressure, pulse, and                            oxygen saturations were monitored continuously. The                            Olympus Scope [id number] was introduced through the                            anus and advanced  to the the cecum, identified by                            appendiceal orifice and ileocecal valve. The                            ileocecal valve, appendiceal orifice, and rectum                            were photographed. The quality of the bowel                            preparation was excellent. The colonoscopy was                            performed without difficulty. The patient tolerated                            the procedure well. The bowel preparation used was                            SUPREP via split dose instruction. Scope In: 9:54:53 AM Scope Out: 10:06:28 AM Scope Withdrawal Time: 0 hours 8 minutes 58 seconds  Total Procedure Duration: 0 hours 11 minutes 35 seconds  Findings:                 Many diverticula were found in the ascending colon                            and left colon.                           The exam was otherwise without abnormality on                            direct and retroflexion views. Complications:            No immediate complications. Estimated blood loss:  None. Estimated Blood Loss:     Estimated blood loss: none. Impression:               - Diverticulosis in the ascending colon and in the                            left colon.                           - The examination was otherwise normal on direct                            and retroflexion views.                           - No specimens collected. Recommendation:           - Repeat colonoscopy in 10 years for screening                            purposes.                           - Patient has a contact number available for                            emergencies. The signs and symptoms of potential                            delayed complications were discussed with the                            patient. Return to normal activities tomorrow.                            Written discharge instructions were provided to the                             patient.                           - Resume previous diet.                           - Continue present medications. Norleen SAILOR. Abran, MD 03/27/2024 10:13:48 AM This report has been signed electronically.

## 2024-03-27 NOTE — Progress Notes (Signed)
 HISTORY OF PRESENT ILLNESS:  Deborah Thompson is a 58 y.o. female presents for screening anoscopy.  Previous examination 2015 was negative for neoplasia.  No complaints  REVIEW OF SYSTEMS:  All non-GI ROS negative except for  Past Medical History:  Diagnosis Date   Cardiomyopathy (HCC) 08/1999   after son was born 18 yrs ago, postpartum, resolved soon after childbirth   Depression    Difficult intubation    slight trouble with intubation on 07-11-17 surgery, no problems with intubation since   GERD (gastroesophageal reflux disease)    History of gestational diabetes    WITH FIRST PREGNANCY   History of kidney stones    Hypercholesterolemia    Hypertension    Migraines    PONV (postoperative nausea and vomiting)    Preeclampsia    Skin cancer 2015   removed from left shin   Stroke (HCC) 03/02/2016   reports that she had difficulty in making words, resolved now   Wears glasses 03/26/2021    Past Surgical History:  Procedure Laterality Date   ANEURYSM COILING  05/05/2016   right para opthalmic aneurysm coiling done dr kattie   CYSTOSCOPY WITH RETROGRADE PYELOGRAM, URETEROSCOPY AND STENT PLACEMENT Left 03/31/2021   Procedure: CYSTOSCOPY WITH RETROGRADE PYELOGRAM, URETEROSCOPY, LASER LITHOTRIPSY, STONE BASKETRY  AND STENT PLACEMENT;  Surgeon: Elisabeth Valli BIRCH, MD;  Location: [facility];  Service: Urology;  Laterality: Left;   CYSTOSCOPY WITH STENT PLACEMENT Left 07/11/2017   Procedure: CYSTOSCOPY WITH STENT PLACEMENT;  Surgeon: Sherrilee Belvie CROME, MD;  Location: [facility];  Service: Urology;  Laterality: Left;   HOLMIUM LASER APPLICATION Left 07/11/2017   Procedure: HOLMIUM LASER APPLICATION;  Surgeon: Sherrilee Belvie CROME, MD;  Location: [facility];  Service: Urology;  Laterality: Left;   HOLMIUM LASER APPLICATION Left 03/31/2021   Procedure: HOLMIUM LASER APPLICATION;  Surgeon: Elisabeth Valli BIRCH, MD;  Location: [facility];  Service: Urology;   Laterality: Left;   IR GENERIC HISTORICAL  03/30/2016   IR RADIOLOGIST EVAL & MGMT 03/30/2016 [facility]   IR GENERIC HISTORICAL  04/02/2016   IR ANGIO INTRA EXTRACRAN SEL COM CAROTID INNOMINATE BILAT MOD SED 04/02/2016 Thyra Nash, MD [facility]   IR GENERIC HISTORICAL  04/02/2016   IR ANGIO VERTEBRAL SEL VERTEBRAL BILAT MOD SED 04/02/2016 Thyra Nash, MD [facility]   IR GENERIC HISTORICAL  04/13/2016   IR RADIOLOGIST EVAL & MGMT 04/13/2016 [facility]   IR GENERIC HISTORICAL  05/05/2016   IR ANGIOGRAM FOLLOW UP STUDY 05/05/2016 Thyra Nash, MD [facility]   IR GENERIC HISTORICAL  05/05/2016   IR ANGIO INTRA EXTRACRAN SEL INTERNAL CAROTID UNI R MOD SED 05/05/2016 Thyra Nash, MD [facility]   IR GENERIC HISTORICAL  05/05/2016   IR TRANSCATH/EMBOLIZ 05/05/2016 Thyra Nash, MD [facility]   IR GENERIC HISTORICAL  05/20/2016   IR RADIOLOGIST EVAL & MGMT 05/20/2016 [facility]   NEPHROLITHOTOMY  04/1999   during pregnancy, left kidney   NEPHROLITHOTOMY Left 07/11/2017   Procedure: NEPHROLITHOTOMY PERCUTANEOUS;  Surgeon: Sherrilee Belvie CROME, MD;  Location: [facility];  Service: Urology;  Laterality: Left;   NEPHROLITHOTOMY Left 07/21/2017   Procedure: NEPHROLITHOTOMY PERCUTANEOUS SECOND LOOK;  Surgeon: Sherrilee Belvie CROME, MD;  Location: [facility];  Service: Urology;  Laterality: Left;   RADIOLOGY WITH ANESTHESIA N/A 04/26/2016   Procedure: EMBOLIZATION;  Surgeon: Medication Radiologist, MD;  Location: [facility];  Service: Radiology;  Laterality: N/A;   RADIOLOGY WITH ANESTHESIA N/A 05/05/2016   Procedure: EMBOLIZATION;  Surgeon: Thyra Nash, MD;  Location: [facility];  Service: Radiology;  Laterality: N/A;   skin cancer removal     basal cell yrs ago per pt on 03-26-2021   WISDOM TOOTH EXTRACTION  1984    Social History DELL HURTUBISE  reports that she has quit smoking. Her smoking use included cigarettes. She has a 4 pack-year smoking history. She has  never used smokeless tobacco. She reports current alcohol use. She reports that she does not use drugs.  family history includes Heart disease in her father and mother.  No Known Allergies     PHYSICAL EXAMINATION: Vital signs: BP 124/73   Pulse 71   Temp 98.4 F (36.9 C)   Resp 12   Ht 5' 6 (1.676 m)   Wt 180 lb (81.6 kg)   LMP 07/21/2001   SpO2 99%   BMI 29.05 kg/m  General: Well-developed, well-nourished, no acute distress HEENT: Sclerae are anicteric, conjunctiva pink. Oral mucosa intact Lungs: Clear Heart: Regular Abdomen: soft, nontender, nondistended, no obvious ascites, no peritoneal signs, normal bowel sounds. No organomegaly. Extremities: No edema Psychiatric: alert and oriented x3. Cooperative     ASSESSMENT:  Colon cancer screening   PLAN:  Screening colonoscopy

## 2024-03-27 NOTE — Patient Instructions (Signed)
 Repeat colonoscopy in 10 years for screening purposes. Resume previous diet. Continue present medications.  Handout provided on diverticulosis    YOU HAD AN ENDOSCOPIC PROCEDURE TODAY AT THE [facility]:   Refer to the procedure report that was given to you for any specific questions about what was found during the examination.  If the procedure report does not answer your questions, please call your gastroenterologist to clarify.  If you requested that your care partner not be given the details of your procedure findings, then the procedure report has been included in a sealed envelope for you to review at your convenience later.  YOU SHOULD EXPECT: Some feelings of bloating in the abdomen. Passage of more gas than usual.  Walking can help get rid of the air that was put into your GI tract during the procedure and reduce the bloating. If you had a lower endoscopy (such as a colonoscopy or flexible sigmoidoscopy) you may notice spotting of blood in your stool or on the toilet paper. If you underwent a bowel prep for your procedure, you may not have a normal bowel movement for a few days.  Please Note:  You might notice some irritation and congestion in your nose or some drainage.  This is from the oxygen used during your procedure.  There is no need for concern and it should clear up in a day or so.  SYMPTOMS TO REPORT IMMEDIATELY:  Following lower endoscopy (colonoscopy or flexible sigmoidoscopy):  Excessive amounts of blood in the stool  Significant tenderness or worsening of abdominal pains  Swelling of the abdomen that is new, acute  Fever of 100F or higher  For urgent or emergent issues, a gastroenterologist can be reached at any hour by calling [phone]. Do not use MyChart messaging for urgent concerns.    DIET:  We do recommend a small meal at first, but then you may proceed to your regular diet.  Drink plenty of fluids but you should avoid alcoholic beverages for  24 hours.  ACTIVITY:  You should plan to take it easy for the rest of today and you should NOT DRIVE or use heavy machinery until tomorrow (because of the sedation medicines used during the test).    FOLLOW UP: Our staff will call the number listed on your records the next business day following your procedure.  We will call around 7:15- 8:00 am to check on you and address any questions or concerns that you may have regarding the information given to you following your procedure. If we do not reach you, we will leave a message.     If any biopsies were taken you will be contacted by phone or by letter within the next 1-3 weeks.  Please call us  at [phone] if you have not heard about the biopsies in 3 weeks.    SIGNATURES/CONFIDENTIALITY: You and/or your care partner have signed paperwork which will be entered into your electronic medical record.  These signatures attest to the fact that that the information above on your After Visit Summary has been reviewed and is understood.  Full responsibility of the confidentiality of this discharge information lies with you and/or your care-partner.

## 2024-03-27 NOTE — Progress Notes (Signed)
 Report to PACU, RN, vss, BBS= Clear.

## 2024-04-06 DIAGNOSIS — E118 Type 2 diabetes mellitus with unspecified complications: Secondary | ICD-10-CM | POA: Diagnosis not present

## 2024-04-07 ENCOUNTER — Other Ambulatory Visit (HOSPITAL_COMMUNITY): Payer: Self-pay

## 2024-04-07 MED ORDER — OZEMPIC (0.25 OR 0.5 MG/DOSE) 2 MG/3ML ~~LOC~~ SOPN
0.2500 mg | PEN_INJECTOR | SUBCUTANEOUS | 1 refills | Status: AC
  Filled 2024-04-07 – 2024-04-09 (×2): qty 3, 30d supply, fill #0

## 2024-04-09 ENCOUNTER — Other Ambulatory Visit (HOSPITAL_COMMUNITY): Payer: Self-pay

## 2024-04-09 ENCOUNTER — Other Ambulatory Visit: Payer: Self-pay

## 2024-04-09 MED ORDER — VENLAFAXINE HCL ER 37.5 MG PO CP24
37.5000 mg | ORAL_CAPSULE | Freq: Every day | ORAL | 3 refills | Status: AC
  Filled 2024-04-09: qty 90, 90d supply, fill #0
  Filled 2024-07-06: qty 90, 90d supply, fill #1

## 2024-04-17 ENCOUNTER — Other Ambulatory Visit (HOSPITAL_BASED_OUTPATIENT_CLINIC_OR_DEPARTMENT_OTHER): Payer: Self-pay

## 2024-04-17 MED ORDER — FLUZONE 0.5 ML IM SUSY
0.5000 mL | PREFILLED_SYRINGE | Freq: Once | INTRAMUSCULAR | 0 refills | Status: AC
  Filled 2024-04-17: qty 0.5, 1d supply, fill #0

## 2024-04-20 ENCOUNTER — Other Ambulatory Visit (HOSPITAL_COMMUNITY): Payer: Self-pay

## 2024-04-20 ENCOUNTER — Other Ambulatory Visit: Payer: Self-pay

## 2024-04-20 MED ORDER — COVID-19 MRNA VAC-TRIS(PFIZER) 30 MCG/0.3ML IM SUSY
0.3000 mL | PREFILLED_SYRINGE | Freq: Once | INTRAMUSCULAR | 0 refills | Status: AC
  Filled 2024-04-20: qty 0.3, 1d supply, fill #0

## 2024-04-23 ENCOUNTER — Other Ambulatory Visit: Payer: Self-pay

## 2024-04-23 ENCOUNTER — Other Ambulatory Visit (HOSPITAL_COMMUNITY): Payer: Self-pay

## 2024-04-23 MED ORDER — METOPROLOL SUCCINATE ER 50 MG PO TB24
50.0000 mg | ORAL_TABLET | Freq: Every day | ORAL | 3 refills | Status: AC
  Filled 2024-04-23: qty 90, 90d supply, fill #0
  Filled 2024-07-21: qty 90, 90d supply, fill #1

## 2024-04-23 MED ORDER — LISINOPRIL 40 MG PO TABS
40.0000 mg | ORAL_TABLET | Freq: Every day | ORAL | 3 refills | Status: AC
  Filled 2024-04-23: qty 90, 90d supply, fill #0
  Filled 2024-07-21: qty 90, 90d supply, fill #1

## 2024-04-27 DIAGNOSIS — R1084 Generalized abdominal pain: Secondary | ICD-10-CM | POA: Diagnosis not present

## 2024-04-27 DIAGNOSIS — N200 Calculus of kidney: Secondary | ICD-10-CM | POA: Diagnosis not present

## 2024-04-27 DIAGNOSIS — N3020 Other chronic cystitis without hematuria: Secondary | ICD-10-CM | POA: Diagnosis not present

## 2024-04-27 DIAGNOSIS — R310 Gross hematuria: Secondary | ICD-10-CM | POA: Diagnosis not present

## 2024-05-03 ENCOUNTER — Other Ambulatory Visit (HOSPITAL_COMMUNITY): Payer: Self-pay

## 2024-05-03 MED ORDER — CIPROFLOXACIN HCL 500 MG PO TABS
500.0000 mg | ORAL_TABLET | Freq: Two times a day (BID) | ORAL | 0 refills | Status: AC
  Filled 2024-05-03 (×2): qty 20, 10d supply, fill #0

## 2024-05-08 ENCOUNTER — Other Ambulatory Visit (HOSPITAL_COMMUNITY): Payer: Self-pay

## 2024-05-08 ENCOUNTER — Other Ambulatory Visit: Payer: Self-pay

## 2024-05-08 DIAGNOSIS — E118 Type 2 diabetes mellitus with unspecified complications: Secondary | ICD-10-CM | POA: Diagnosis not present

## 2024-05-08 MED ORDER — OZEMPIC (0.25 OR 0.5 MG/DOSE) 2 MG/3ML ~~LOC~~ SOPN
0.5000 mg | PEN_INJECTOR | SUBCUTANEOUS | 3 refills | Status: AC
  Filled 2024-05-08: qty 9, 84d supply, fill #0

## 2024-05-12 ENCOUNTER — Other Ambulatory Visit (HOSPITAL_COMMUNITY): Payer: Self-pay

## 2024-05-14 ENCOUNTER — Other Ambulatory Visit: Payer: Self-pay

## 2024-05-15 ENCOUNTER — Other Ambulatory Visit (HOSPITAL_COMMUNITY): Payer: Self-pay

## 2024-05-15 MED ORDER — ATORVASTATIN CALCIUM 40 MG PO TABS
40.0000 mg | ORAL_TABLET | Freq: Every day | ORAL | 1 refills | Status: AC
  Filled 2024-05-15: qty 90, 90d supply, fill #0
  Filled 2024-09-07: qty 90, 90d supply, fill #1

## 2024-05-17 ENCOUNTER — Other Ambulatory Visit (HOSPITAL_COMMUNITY): Payer: Self-pay

## 2024-05-19 ENCOUNTER — Other Ambulatory Visit (HOSPITAL_COMMUNITY): Payer: Self-pay

## 2024-05-22 DIAGNOSIS — R310 Gross hematuria: Secondary | ICD-10-CM | POA: Diagnosis not present

## 2024-05-22 DIAGNOSIS — K5730 Diverticulosis of large intestine without perforation or abscess without bleeding: Secondary | ICD-10-CM | POA: Diagnosis not present

## 2024-05-22 DIAGNOSIS — K760 Fatty (change of) liver, not elsewhere classified: Secondary | ICD-10-CM | POA: Diagnosis not present

## 2024-05-22 DIAGNOSIS — N2889 Other specified disorders of kidney and ureter: Secondary | ICD-10-CM | POA: Diagnosis not present

## 2024-05-24 ENCOUNTER — Other Ambulatory Visit (HOSPITAL_COMMUNITY): Payer: Self-pay

## 2024-05-25 ENCOUNTER — Other Ambulatory Visit (HOSPITAL_COMMUNITY): Payer: Self-pay

## 2024-05-26 ENCOUNTER — Other Ambulatory Visit (HOSPITAL_COMMUNITY): Payer: Self-pay

## 2024-05-28 ENCOUNTER — Other Ambulatory Visit (HOSPITAL_COMMUNITY): Payer: Self-pay

## 2024-05-28 ENCOUNTER — Other Ambulatory Visit: Payer: Self-pay

## 2024-05-28 MED ORDER — METFORMIN HCL ER 750 MG PO TB24
750.0000 mg | ORAL_TABLET | Freq: Every evening | ORAL | 1 refills | Status: AC
  Filled 2024-05-28: qty 90, 90d supply, fill #0

## 2024-05-28 MED ORDER — SPIRONOLACTONE 25 MG PO TABS
12.5000 mg | ORAL_TABLET | Freq: Every day | ORAL | 1 refills | Status: AC
  Filled 2024-05-28: qty 45, 90d supply, fill #0
  Filled 2024-08-25: qty 45, 90d supply, fill #1

## 2024-06-09 ENCOUNTER — Other Ambulatory Visit (HOSPITAL_COMMUNITY): Payer: Self-pay

## 2024-06-11 ENCOUNTER — Other Ambulatory Visit (HOSPITAL_COMMUNITY): Payer: Self-pay

## 2024-06-11 ENCOUNTER — Other Ambulatory Visit: Payer: Self-pay

## 2024-06-11 MED ORDER — PANTOPRAZOLE SODIUM 40 MG PO TBEC
40.0000 mg | DELAYED_RELEASE_TABLET | Freq: Every day | ORAL | 1 refills | Status: AC
  Filled 2024-06-11: qty 90, 90d supply, fill #0

## 2024-06-18 ENCOUNTER — Other Ambulatory Visit (HOSPITAL_COMMUNITY): Payer: Self-pay

## 2024-06-18 ENCOUNTER — Other Ambulatory Visit: Payer: Self-pay

## 2024-06-18 ENCOUNTER — Ambulatory Visit
Admission: EM | Admit: 2024-06-18 | Discharge: 2024-06-18 | Disposition: A | Discharge status: Home / Self Care | Attending: Urgent Care | Admitting: Urgent Care

## 2024-06-18 DIAGNOSIS — R300 Dysuria: Secondary | ICD-10-CM

## 2024-06-18 DIAGNOSIS — N390 Urinary tract infection, site not specified: Secondary | ICD-10-CM

## 2024-06-18 LAB — POCT URINE DIPSTICK
Bilirubin, UA: NEGATIVE
Glucose, UA: NEGATIVE mg/dL
Ketones, POC UA: NEGATIVE mg/dL
Nitrite, UA: NEGATIVE
POC PROTEIN,UA: >=300 — AB
Spec Grav, UA: 1.025 (ref 1.010–1.025)
Urobilinogen, UA: 0.2 U/dL
pH, UA: 5.5 (ref 5.0–8.0)

## 2024-06-18 MED ORDER — CEPHALEXIN 500 MG PO CAPS
500.0000 mg | ORAL_CAPSULE | Freq: Two times a day (BID) | ORAL | 0 refills | Status: AC
  Filled 2024-06-18: qty 14, 7d supply, fill #0

## 2024-06-18 NOTE — Discharge Instructions (Addendum)
 Please start Keflex  to address an urinary tract infection. Make sure you hydrate very well with plain water  and a quantity of 80 ounces of water  a day.  Please limit drinks that are considered urinary irritants such as fruit juices, soda, sweet tea, coffee, artifical sweetened drinks, energy drinks, alcohol.  These can worsen your urinary and genital symptoms but also be the source of them.  I will let you know about your urine culture results through MyChart to see if we need to prescribe or change your antibiotics based off of those results.

## 2024-06-18 NOTE — ED Provider Notes (Signed)
 [facility]  Note:  This document was prepared using Conservation officer, historic buildings and may include unintentional dictation errors.  MRN: [id number] DOB: Jul 01, 1966  Subjective:   Deborah Thompson is a 58 y.o. female presenting for 1 week history of persistent dysuria, urinary urgency, urinary frequency.  Patient has a history of UTIs.  Last 1 was a month ago.  Took Cipro  for this.  Has a recheck with her urologist tomorrow for her kidney stone. Denies fever, n/v, abdominal pain, pelvic pain, rashes, hematuria, vaginal discharge.    No current facility-administered medications for this encounter.  Current Outpatient Medications:    acetaminophen  (TYLENOL ) 500 MG tablet, Take 500 mg by mouth every 6 (six) hours as needed (for pain.)., Disp: , Rfl:    aspirin  EC 81 MG tablet, Take 81 mg by mouth daily., Disp: , Rfl:    atorvastatin  (LIPITOR) 40 MG tablet, Take 1 tablet (40 mg total) by mouth daily for cholesterol., Disp: 100 tablet, Rfl: 1   Calcium  Carb-Cholecalciferol (CALCIUM  PLUS VITAMIN D3 PO), Take 1 tablet by mouth 2 (two) times daily., Disp: , Rfl:    ciprofloxacin  (CIPRO ) 500 MG tablet, Take 1 tablet (500 mg total) by mouth every 12 (twelve) hours for 10 days., Disp: 20 tablet, Rfl: 0   lisinopril  (ZESTRIL ) 40 MG tablet, Take 1 tablet (40 mg total) by mouth daily for blood pressure., Disp: 100 tablet, Rfl: 3   metFORMIN  (GLUCOPHAGE -XR) 750 MG 24 hr tablet, Take 1 tablet (750 mg total) by mouth every evening with a meal., Disp: 100 tablet, Rfl: 1   metoprolol  succinate (TOPROL -XL) 50 MG 24 hr tablet, Take 1 tablet (50 mg total) by mouth daily for blood pressure, Disp: 100 tablet, Rfl: 3   Multiple Vitamin (MULTIVITAMIN WITH MINERALS) TABS tablet, Take 1 tablet by mouth at bedtime., Disp: , Rfl:    Omega-3 Fatty Acids (FISH OIL) 1000 MG CPDR, Take 1,000 mg by mouth 2 (two) times daily. , Disp: , Rfl:    pantoprazole  (PROTONIX ) 40 MG tablet, Take 1 tablet (40 mg  total) by mouth daily for acid reflux., Disp: 100 tablet, Rfl: 1   Semaglutide ,0.25 or 0.5MG /DOS, (OZEMPIC , 0.25 OR 0.5 MG/DOSE,) 2 MG/3ML SOPN, Inject 0.25 mg into the skin once a week., Disp: 6 mL, Rfl: 1   Semaglutide ,0.25 or 0.5MG /DOS, (OZEMPIC , 0.25 OR 0.5 MG/DOSE,) 2 MG/3ML SOPN, Inject 0.25 mg into the skin once a week., Disp: 4 mL, Rfl: 1   Semaglutide ,0.25 or 0.5MG /DOS, (OZEMPIC , 0.25 OR 0.5 MG/DOSE,) 2 MG/3ML SOPN, Inject 0.5 mg into the skin once a week., Disp: 12 mL, Rfl: 3   spironolactone  (ALDACTONE ) 25 MG tablet, Take 0.5 tablets (12.5 mg total) by mouth daily for blood pressure., Disp: 50 tablet, Rfl: 1   triamcinolone  cream (KENALOG ) 0.5 %, Apply 1 application topically 2 (two) times daily to the affected area as needed. Use sparingly. (Patient not taking: Reported on 03/27/2024), Disp: 15 g, Rfl: 0   venlafaxine  XR (EFFEXOR -XR) 37.5 MG 24 hr capsule, Take 1 capsule (37.5 mg total) by mouth daily., Disp: 100 capsule, Rfl: 3   No Known Allergies  Past Medical History:  Diagnosis Date   Cardiomyopathy (HCC) 08/1999   after son was born 18 yrs ago, postpartum, resolved soon after childbirth   Depression    Difficult intubation    slight trouble with intubation on 07-11-17 surgery, no problems with intubation since   GERD (gastroesophageal reflux disease)    History of gestational diabetes  WITH FIRST PREGNANCY   History of kidney stones    Hypercholesterolemia    Hypertension    Migraines    PONV (postoperative nausea and vomiting)    Preeclampsia    Skin cancer 2015   removed from left shin   Stroke (HCC) 03/02/2016   reports that she had difficulty in making words, resolved now   Wears glasses 03/26/2021     Past Surgical History:  Procedure Laterality Date   ANEURYSM COILING  05/05/2016   right para opthalmic aneurysm coiling done dr kattie   CYSTOSCOPY WITH RETROGRADE PYELOGRAM, URETEROSCOPY AND STENT PLACEMENT Left 03/31/2021   Procedure: CYSTOSCOPY WITH  RETROGRADE PYELOGRAM, URETEROSCOPY, LASER LITHOTRIPSY, STONE BASKETRY  AND STENT PLACEMENT;  Surgeon: Elisabeth Valli BIRCH, MD;  Location: [facility];  Service: Urology;  Laterality: Left;   CYSTOSCOPY WITH STENT PLACEMENT Left 07/11/2017   Procedure: CYSTOSCOPY WITH STENT PLACEMENT;  Surgeon: Sherrilee Belvie CROME, MD;  Location: [facility];  Service: Urology;  Laterality: Left;   HOLMIUM LASER APPLICATION Left 07/11/2017   Procedure: HOLMIUM LASER APPLICATION;  Surgeon: Sherrilee Belvie CROME, MD;  Location: [facility];  Service: Urology;  Laterality: Left;   HOLMIUM LASER APPLICATION Left 03/31/2021   Procedure: HOLMIUM LASER APPLICATION;  Surgeon: Elisabeth Valli BIRCH, MD;  Location: [facility];  Service: Urology;  Laterality: Left;   IR GENERIC HISTORICAL  03/30/2016   IR RADIOLOGIST EVAL & MGMT 03/30/2016 [facility]   IR GENERIC HISTORICAL  04/02/2016   IR ANGIO INTRA EXTRACRAN SEL COM CAROTID INNOMINATE BILAT MOD SED 04/02/2016 Thyra Nash, MD [facility]   IR GENERIC HISTORICAL  04/02/2016   IR ANGIO VERTEBRAL SEL VERTEBRAL BILAT MOD SED 04/02/2016 Thyra Nash, MD [facility]   IR GENERIC HISTORICAL  04/13/2016   IR RADIOLOGIST EVAL & MGMT 04/13/2016 [facility]   IR GENERIC HISTORICAL  05/05/2016   IR ANGIOGRAM FOLLOW UP STUDY 05/05/2016 Thyra Nash, MD [facility]   IR GENERIC HISTORICAL  05/05/2016   IR ANGIO INTRA EXTRACRAN SEL INTERNAL CAROTID UNI R MOD SED 05/05/2016 Thyra Nash, MD [facility]   IR GENERIC HISTORICAL  05/05/2016   IR TRANSCATH/EMBOLIZ 05/05/2016 Thyra Nash, MD [facility]   IR GENERIC HISTORICAL  05/20/2016   IR RADIOLOGIST EVAL & MGMT 05/20/2016 [facility]   NEPHROLITHOTOMY  04/1999   during pregnancy, left kidney   NEPHROLITHOTOMY Left 07/11/2017   Procedure: NEPHROLITHOTOMY PERCUTANEOUS;  Surgeon: Sherrilee Belvie CROME, MD;  Location: [facility];  Service: Urology;  Laterality: Left;   NEPHROLITHOTOMY Left  07/21/2017   Procedure: NEPHROLITHOTOMY PERCUTANEOUS SECOND LOOK;  Surgeon: Sherrilee Belvie CROME, MD;  Location: [facility];  Service: Urology;  Laterality: Left;   RADIOLOGY WITH ANESTHESIA N/A 04/26/2016   Procedure: EMBOLIZATION;  Surgeon: Medication Radiologist, MD;  Location: [facility];  Service: Radiology;  Laterality: N/A;   RADIOLOGY WITH ANESTHESIA N/A 05/05/2016   Procedure: EMBOLIZATION;  Surgeon: Thyra Nash, MD;  Location: [facility];  Service: Radiology;  Laterality: N/A;   skin cancer removal     basal cell yrs ago per pt on 03-26-2021   WISDOM TOOTH EXTRACTION  1984    Family History  Problem Relation Age of Onset   Heart disease Mother    Heart disease Father    Colon cancer Neg Hx    Esophageal cancer Neg Hx    Rectal cancer Neg Hx    Stomach cancer Neg Hx    Colon polyps Neg Hx     Social History  Tobacco Use   Smoking status: Former    Current packs/day: 1.00    Average packs/day: 1 pack/day for 4.0 years (4.0 ttl pk-yrs)    Types: Cigarettes   Smokeless tobacco: Never   Tobacco comments:    quit 1984 or 1984  Vaping Use   Vaping status: Never Used  Substance Use Topics   Alcohol use: Yes    Comment: SOCIALLY   Drug use: No    ROS   Objective:   Vitals: BP 132/82 (BP Location: Right Arm)   Pulse 67   Temp 98.1 F (36.7 C) (Oral)   Resp 16   LMP 07/21/2001   SpO2 97%   Physical Exam Constitutional:      General: She is not in acute distress.    Appearance: Normal appearance. She is well-developed. She is not ill-appearing, toxic-appearing or diaphoretic.  HENT:     Head: Normocephalic and atraumatic.     Nose: Nose normal.     Mouth/Throat:     Mouth: Mucous membranes are moist.     Pharynx: Oropharynx is clear.  Eyes:     General: No scleral icterus.       Right eye: No discharge.        Left eye: No discharge.     Extraocular Movements: Extraocular movements intact.     Conjunctiva/sclera: Conjunctivae normal.  Cardiovascular:      Rate and Rhythm: Normal rate.  Pulmonary:     Effort: Pulmonary effort is normal.  Abdominal:     General: Bowel sounds are normal. There is no distension.     Palpations: Abdomen is soft. There is no mass.     Tenderness: There is no abdominal tenderness. There is no right CVA tenderness, left CVA tenderness, guarding or rebound.  Skin:    General: Skin is warm and dry.  Neurological:     General: No focal deficit present.     Mental Status: She is alert and oriented to person, place, and time.  Psychiatric:        Mood and Affect: Mood normal.        Behavior: Behavior normal.        Thought Content: Thought content normal.        Judgment: Judgment normal.     Results for orders placed or performed during the hospital encounter of 06/18/24 (from the past 24 hours)  POCT URINE DIPSTICK     Status: Abnormal   Collection Time: 06/18/24 11:32 AM  Result Value Ref Range   Color, UA yellow yellow   Clarity, UA hazy (A) clear   Glucose, UA negative negative mg/dL   Bilirubin, UA negative negative   Ketones, POC UA negative negative mg/dL   Spec Grav, UA 8.974 8.989 - 1.025   Blood, UA large (A) negative   pH, UA 5.5 5.0 - 8.0   POC PROTEIN,UA >=300 (A) negative, trace   Urobilinogen, UA 0.2 0.2 or 1.0 E.U./dL   Nitrite, UA Negative Negative   Leukocytes, UA Small (1+) (A) Negative    Assessment and Plan :   PDMP not reviewed this encounter.  1. Recurrent UTI   2. Dysuria    Patient was not able to provide enough urine for an urine culture despite multiple efforts here in clinic.  Start cephalexin  to cover for recurrent acute cystitis, urine culture pending.  Recommended consistent hydration, limiting urinary irritants.  Maintain follow-up with her urologist.  Counseled patient on potential for adverse effects with medications  prescribed/recommended today, ER and return-to-clinic precautions discussed, patient verbalized understanding.    Christopher Savannah, PA-C 06/18/24  [phone]

## 2024-06-18 NOTE — ED Triage Notes (Signed)
 Pt reports frequent urination, pain at the end of urination x 1 week. Pt taking AZO.   Pt reports she has an appt with Urologist tomorrow, as she has kidney stone.

## 2024-06-19 ENCOUNTER — Other Ambulatory Visit (HOSPITAL_COMMUNITY): Payer: Self-pay

## 2024-06-19 ENCOUNTER — Other Ambulatory Visit: Payer: Self-pay

## 2024-06-19 DIAGNOSIS — N3021 Other chronic cystitis with hematuria: Secondary | ICD-10-CM | POA: Diagnosis not present

## 2024-06-19 DIAGNOSIS — N200 Calculus of kidney: Secondary | ICD-10-CM | POA: Diagnosis not present

## 2024-06-19 MED ORDER — ESTRADIOL 0.01 % VA CREA
TOPICAL_CREAM | VAGINAL | 3 refills | Status: AC
  Filled 2024-06-19: qty 42.5, 30d supply, fill #0

## 2024-06-26 ENCOUNTER — Other Ambulatory Visit: Payer: Self-pay

## 2024-06-27 ENCOUNTER — Other Ambulatory Visit: Payer: Self-pay

## 2024-07-06 ENCOUNTER — Other Ambulatory Visit (HOSPITAL_COMMUNITY): Payer: Self-pay

## 2024-07-07 ENCOUNTER — Other Ambulatory Visit (HOSPITAL_COMMUNITY): Payer: Self-pay

## 2024-07-11 DIAGNOSIS — Z1331 Encounter for screening for depression: Secondary | ICD-10-CM | POA: Diagnosis not present

## 2024-07-11 DIAGNOSIS — Z01419 Encounter for gynecological examination (general) (routine) without abnormal findings: Secondary | ICD-10-CM | POA: Diagnosis not present

## 2024-07-11 DIAGNOSIS — R87610 Atypical squamous cells of undetermined significance on cytologic smear of cervix (ASC-US): Secondary | ICD-10-CM | POA: Diagnosis not present

## 2024-07-11 DIAGNOSIS — Z1231 Encounter for screening mammogram for malignant neoplasm of breast: Secondary | ICD-10-CM | POA: Diagnosis not present

## 2024-07-21 ENCOUNTER — Other Ambulatory Visit (HOSPITAL_COMMUNITY): Payer: Self-pay

## 2024-07-23 ENCOUNTER — Other Ambulatory Visit (HOSPITAL_COMMUNITY): Payer: Self-pay

## 2024-07-24 ENCOUNTER — Other Ambulatory Visit: Payer: Self-pay

## 2024-08-09 ENCOUNTER — Other Ambulatory Visit (HOSPITAL_COMMUNITY): Payer: Self-pay

## 2024-08-10 ENCOUNTER — Other Ambulatory Visit (HOSPITAL_COMMUNITY): Payer: Self-pay

## 2024-08-10 ENCOUNTER — Other Ambulatory Visit: Payer: Self-pay

## 2024-08-10 MED ORDER — ATORVASTATIN CALCIUM 40 MG PO TABS
40.0000 mg | ORAL_TABLET | Freq: Every day | ORAL | 3 refills | Status: AC
  Filled 2024-08-10 (×2): qty 90, 90d supply, fill #0

## 2024-08-10 MED ORDER — SPIRONOLACTONE 25 MG PO TABS: 12.5000 mg | ORAL_TABLET | Freq: Every day | ORAL | 3 refills | Status: AC

## 2024-08-10 MED ORDER — VENLAFAXINE HCL ER 37.5 MG PO CP24
37.5000 mg | ORAL_CAPSULE | Freq: Every day | ORAL | 3 refills | Status: AC
  Filled 2024-09-07: qty 100, 100d supply, fill #0

## 2024-08-10 MED ORDER — PANTOPRAZOLE SODIUM 40 MG PO TBEC
40.0000 mg | DELAYED_RELEASE_TABLET | Freq: Every day | ORAL | 3 refills | Status: AC
  Filled 2024-09-07: qty 100, 100d supply, fill #0

## 2024-08-10 MED ORDER — OZEMPIC (0.25 OR 0.5 MG/DOSE) 2 MG/3ML ~~LOC~~ SOPN
0.5000 mg | PEN_INJECTOR | SUBCUTANEOUS | 11 refills | Status: AC
  Filled 2024-08-10: qty 9, 84d supply, fill #0

## 2024-08-10 MED ORDER — METFORMIN HCL ER 750 MG PO TB24
750.0000 mg | ORAL_TABLET | Freq: Every evening | ORAL | 3 refills | Status: AC
  Filled 2024-08-25: qty 100, 100d supply, fill #0

## 2024-08-10 MED ORDER — LISINOPRIL 40 MG PO TABS
40.0000 mg | ORAL_TABLET | Freq: Every day | ORAL | 3 refills | Status: AC
  Filled 2024-08-10: qty 90, 90d supply, fill #0

## 2024-08-12 ENCOUNTER — Other Ambulatory Visit: Payer: Self-pay

## 2024-08-13 ENCOUNTER — Other Ambulatory Visit: Payer: Self-pay

## 2024-08-25 ENCOUNTER — Other Ambulatory Visit (HOSPITAL_COMMUNITY): Payer: Self-pay

## 2024-08-26 ENCOUNTER — Other Ambulatory Visit: Payer: Self-pay

## 2024-09-07 ENCOUNTER — Other Ambulatory Visit: Payer: Self-pay

## 2024-09-07 ENCOUNTER — Other Ambulatory Visit (HOSPITAL_COMMUNITY): Payer: Self-pay
